# Patient Record
Sex: Female | Born: 1935 | Race: White | Hispanic: No | Smoking: Never smoker
Health system: Southern US, Community
[De-identification: ages and names within clinical notes are randomized; demographics above are authoritative.]

## PROBLEM LIST (undated history)

## (undated) DIAGNOSIS — E7800 Pure hypercholesterolemia, unspecified: Secondary | ICD-10-CM

## (undated) DIAGNOSIS — I10 Essential (primary) hypertension: Secondary | ICD-10-CM

## (undated) HISTORY — PX: BLADDER REPAIR: SHX76

## (undated) HISTORY — PX: ABDOMINAL HYSTERECTOMY: SHX81

---

## 2004-09-09 ENCOUNTER — Ambulatory Visit: Payer: Self-pay | Admitting: Internal Medicine

## 2005-12-14 ENCOUNTER — Ambulatory Visit: Payer: Self-pay | Admitting: Internal Medicine

## 2005-12-28 ENCOUNTER — Ambulatory Visit: Payer: Self-pay | Admitting: Podiatry

## 2005-12-29 ENCOUNTER — Ambulatory Visit: Payer: Self-pay | Admitting: Podiatry

## 2006-02-27 ENCOUNTER — Ambulatory Visit: Payer: Self-pay | Admitting: Unknown Physician Specialty

## 2006-10-31 ENCOUNTER — Ambulatory Visit: Payer: Self-pay | Admitting: Obstetrics and Gynecology

## 2006-11-07 ENCOUNTER — Inpatient Hospital Stay: Payer: Self-pay | Admitting: Obstetrics and Gynecology

## 2007-02-07 ENCOUNTER — Ambulatory Visit: Payer: Self-pay | Admitting: Internal Medicine

## 2007-12-06 ENCOUNTER — Ambulatory Visit: Payer: Self-pay | Admitting: Internal Medicine

## 2008-02-12 ENCOUNTER — Ambulatory Visit: Payer: Self-pay | Admitting: Internal Medicine

## 2008-03-05 ENCOUNTER — Ambulatory Visit: Payer: Self-pay | Admitting: Podiatry

## 2008-03-14 ENCOUNTER — Ambulatory Visit: Payer: Self-pay | Admitting: Podiatry

## 2009-02-09 ENCOUNTER — Ambulatory Visit: Payer: Self-pay | Admitting: Internal Medicine

## 2009-02-17 ENCOUNTER — Ambulatory Visit: Payer: Self-pay | Admitting: Internal Medicine

## 2009-03-10 ENCOUNTER — Ambulatory Visit: Payer: Self-pay | Admitting: Internal Medicine

## 2010-04-27 ENCOUNTER — Ambulatory Visit: Payer: Self-pay | Admitting: Internal Medicine

## 2010-10-27 ENCOUNTER — Ambulatory Visit: Payer: Self-pay | Admitting: Ophthalmology

## 2010-10-27 DIAGNOSIS — I119 Hypertensive heart disease without heart failure: Secondary | ICD-10-CM

## 2010-11-09 ENCOUNTER — Ambulatory Visit: Payer: Self-pay | Admitting: Ophthalmology

## 2011-01-27 ENCOUNTER — Ambulatory Visit: Payer: Self-pay | Admitting: Internal Medicine

## 2011-02-15 ENCOUNTER — Ambulatory Visit: Payer: Self-pay | Admitting: Ophthalmology

## 2011-05-02 ENCOUNTER — Ambulatory Visit: Payer: Self-pay | Admitting: Internal Medicine

## 2011-10-20 ENCOUNTER — Ambulatory Visit: Payer: Self-pay | Admitting: Internal Medicine

## 2011-10-25 ENCOUNTER — Ambulatory Visit: Payer: Self-pay | Admitting: Internal Medicine

## 2012-07-23 ENCOUNTER — Ambulatory Visit: Payer: Medicare Other | Admitting: Internal Medicine

## 2012-08-22 ENCOUNTER — Ambulatory Visit: Payer: Self-pay | Admitting: Family Medicine

## 2012-08-29 ENCOUNTER — Ambulatory Visit: Payer: Self-pay | Admitting: Physical Medicine and Rehabilitation

## 2012-10-01 ENCOUNTER — Other Ambulatory Visit: Payer: Self-pay | Admitting: *Deleted

## 2012-10-01 NOTE — Telephone Encounter (Signed)
Refill Request  Verapamil ER  240 mg tablet  #90  Take one tablet daily

## 2012-10-02 NOTE — Telephone Encounter (Signed)
Called pharmacy, advised  to send refill to [facility].

## 2012-10-02 NOTE — Telephone Encounter (Signed)
Pharmacy sent refill request. Does she need appointment first or ok to refill?

## 2012-10-02 NOTE — Telephone Encounter (Signed)
See my message. Has not called to establish care here. Notify pharmacy to send to [facility].

## 2012-10-02 NOTE — Telephone Encounter (Signed)
She was a pt of mine at [facility].  She has not called here to establish care.  Will need to notify pharmacy to let [facility] know - needs referral.  Cannot fill until she has contacted our office to establish care.

## 2012-10-04 ENCOUNTER — Other Ambulatory Visit: Payer: Self-pay | Admitting: *Deleted

## 2012-11-21 ENCOUNTER — Ambulatory Visit: Payer: Self-pay | Admitting: Family Medicine

## 2015-07-07 DIAGNOSIS — H353132 Nonexudative age-related macular degeneration, bilateral, intermediate dry stage: Secondary | ICD-10-CM | POA: Diagnosis not present

## 2015-09-01 DIAGNOSIS — Z0000 Encounter for general adult medical examination without abnormal findings: Secondary | ICD-10-CM | POA: Diagnosis not present

## 2015-09-01 DIAGNOSIS — I10 Essential (primary) hypertension: Secondary | ICD-10-CM | POA: Diagnosis not present

## 2015-09-04 DIAGNOSIS — I10 Essential (primary) hypertension: Secondary | ICD-10-CM | POA: Diagnosis not present

## 2015-09-11 DIAGNOSIS — H903 Sensorineural hearing loss, bilateral: Secondary | ICD-10-CM | POA: Diagnosis not present

## 2015-09-11 DIAGNOSIS — H6123 Impacted cerumen, bilateral: Secondary | ICD-10-CM | POA: Diagnosis not present

## 2015-10-01 DIAGNOSIS — E7800 Pure hypercholesterolemia, unspecified: Secondary | ICD-10-CM | POA: Diagnosis not present

## 2015-10-01 DIAGNOSIS — F325 Major depressive disorder, single episode, in full remission: Secondary | ICD-10-CM | POA: Diagnosis not present

## 2015-10-01 DIAGNOSIS — I10 Essential (primary) hypertension: Secondary | ICD-10-CM | POA: Diagnosis not present

## 2015-10-22 DIAGNOSIS — R002 Palpitations: Secondary | ICD-10-CM | POA: Diagnosis not present

## 2015-10-22 DIAGNOSIS — I10 Essential (primary) hypertension: Secondary | ICD-10-CM | POA: Diagnosis not present

## 2015-10-22 DIAGNOSIS — F324 Major depressive disorder, single episode, in partial remission: Secondary | ICD-10-CM | POA: Diagnosis not present

## 2015-10-22 DIAGNOSIS — E7800 Pure hypercholesterolemia, unspecified: Secondary | ICD-10-CM | POA: Diagnosis not present

## 2015-10-22 DIAGNOSIS — R9431 Abnormal electrocardiogram [ECG] [EKG]: Secondary | ICD-10-CM | POA: Diagnosis not present

## 2015-10-23 DIAGNOSIS — R9431 Abnormal electrocardiogram [ECG] [EKG]: Secondary | ICD-10-CM | POA: Diagnosis not present

## 2015-10-23 DIAGNOSIS — R002 Palpitations: Secondary | ICD-10-CM | POA: Diagnosis not present

## 2015-10-28 DIAGNOSIS — I10 Essential (primary) hypertension: Secondary | ICD-10-CM | POA: Diagnosis not present

## 2015-10-28 DIAGNOSIS — R9431 Abnormal electrocardiogram [ECG] [EKG]: Secondary | ICD-10-CM | POA: Diagnosis not present

## 2015-11-03 DIAGNOSIS — R000 Tachycardia, unspecified: Secondary | ICD-10-CM | POA: Diagnosis not present

## 2015-11-09 DIAGNOSIS — I493 Ventricular premature depolarization: Secondary | ICD-10-CM | POA: Diagnosis not present

## 2015-11-09 DIAGNOSIS — E7800 Pure hypercholesterolemia, unspecified: Secondary | ICD-10-CM | POA: Diagnosis not present

## 2015-11-09 DIAGNOSIS — R002 Palpitations: Secondary | ICD-10-CM | POA: Diagnosis not present

## 2015-11-09 DIAGNOSIS — I10 Essential (primary) hypertension: Secondary | ICD-10-CM | POA: Diagnosis not present

## 2015-11-19 ENCOUNTER — Emergency Department: Payer: PPO

## 2015-11-19 ENCOUNTER — Encounter: Payer: Self-pay | Admitting: Emergency Medicine

## 2015-11-19 ENCOUNTER — Emergency Department
Admission: EM | Admit: 2015-11-19 | Discharge: 2015-11-19 | Disposition: A | Discharge status: Home / Self Care | Payer: PPO | Attending: Emergency Medicine | Admitting: Emergency Medicine

## 2015-11-19 DIAGNOSIS — K5900 Constipation, unspecified: Secondary | ICD-10-CM | POA: Diagnosis not present

## 2015-11-19 DIAGNOSIS — I10 Essential (primary) hypertension: Secondary | ICD-10-CM | POA: Insufficient documentation

## 2015-11-19 HISTORY — DX: Essential (primary) hypertension: I10

## 2015-11-19 HISTORY — DX: Pure hypercholesterolemia, unspecified: E78.00

## 2015-11-19 MED ORDER — MAGNESIUM CITRATE PO SOLN: 0.5000 | Freq: Once | ORAL | Status: DC

## 2015-11-19 MED ORDER — HYDROCORTISONE 2.5 % RE CREA: 1.0000 "application " | TOPICAL_CREAM | Freq: Two times a day (BID) | RECTAL | Status: AC

## 2015-11-19 NOTE — Discharge Instructions (Signed)

## 2015-11-19 NOTE — ED Provider Notes (Signed)
[facility] Emergency Department Provider Note  ____________________________________________  Time seen: Approximately 7:33 PM  I have reviewed the triage vital signs and the nursing notes.   HISTORY  Chief Complaint Constipation   HPI Jasmine Chung is a 82 y.o. female who presents to the emergency department for evaluation of constipation. Symptoms started about a month ago. Her primary care provider called in a prescription for lactulose, which worked initially but has since stopped working. She also complains of bloating and feeling very gassy. She denies change in medication or taking narcotic pain medications. She has also added more fiber to her diet and is drinking a lot of water without relief. She states she passes very small, hard stool when she is able to go. She denies obvious blood or tarry stools. Other than the cramping associated with the gas, she denies abdominal pain. She denies vomiting.   Past Medical History  Diagnosis Date  . Hypertension   . Hypercholesteremia     There are no active problems to display for this patient.   Past Surgical History  Procedure Laterality Date  . Abdominal hysterectomy    . Bladder repair      Current Outpatient Rx  Name  Route  Sig  Dispense  Refill  . hydrocortisone (PROCTO-MED HC) 2.5 % rectal cream   Rectal   Place 1 application rectally 2 (two) times daily.   30 g   0   . magnesium citrate SOLN   Oral   Take 148 mLs (0.5 Bottles total) by mouth once.   195 mL   0     Drink 1/2 bottle. If no results in 6 hours, drink  ...   . verapamil (CALAN-SR) 240 MG CR tablet   Oral   Take 240 mg by mouth at bedtime.           Allergies Review of patient's allergies indicates no known allergies.  History reviewed. No pertinent family history.  Social History Social History  Substance Use Topics  . Smoking status: Never Smoker   . Smokeless tobacco: None  . Alcohol Use: Yes     Review of Systems Constitutional: No fever/chills Eyes: No visual changes. ENT: No sore throat. Cardiovascular: Denies chest pain. Respiratory: Denies shortness of breath. Gastrointestinal: Occasional crampy abdominal pain.  No vomiting.  No diarrhea.  Positive for constipation. Genitourinary: Negative for dysuria.  ____________________________________________   PHYSICAL EXAM:  VITAL SIGNS: ED Triage Vitals  Enc Vitals Group     BP 11/19/15 1920 191/89 mmHg     Pulse Rate 11/19/15 1920 74     Resp 11/19/15 1920 20     Temp 11/19/15 1920 97.7 F (36.5 C)     Temp Source 11/19/15 1920 Oral     SpO2 11/19/15 1920 98 %     Weight 11/19/15 1920 116 lb (52.617 kg)     Height 11/19/15 1920 5' (1.524 m)     Head Cir --      Peak Flow --      Pain Score 11/19/15 1921 0     Pain Loc --      Pain Edu? --      Excl. in [place]? --     Constitutional: Alert and oriented. Well appearing and in no acute distress. Eyes: Conjunctivae are normal.  EOMI. Head: Atraumatic. Nose: No congestion/rhinnorhea. Respiratory: Normal respiratory effort.  No retractions. Gastrointestinal: Soft and nontender. No distention. No abdominal bruits. Musculoskeletal: No lower extremity tenderness nor  edema.  No joint effusions. Neurologic:  Normal speech and language. No gross focal neurologic deficits are appreciated. No gait instability. Skin:  Skin is warm, dry and intact. No rash noted. Psychiatric: Mood and affect are normal. Speech and behavior are normal.  ____________________________________________   LABS (all labs ordered are listed, but only abnormal results are displayed)  Labs Reviewed - No data to display ____________________________________________  EKG   ____________________________________________  RADIOLOGY  Abdomen with large amount of fecal matter throughout the colon. Small bowel gas pattern is  normal. ____________________________________________   PROCEDURES  Procedure(s) performed:   Procedures  Critical Care performed:   ____________________________________________   INITIAL IMPRESSION / ASSESSMENT AND PLAN / ED COURSE  Pertinent labs & imaging results that were available during my care of the patient were reviewed by me and considered in my medical decision making (see chart for details).  Patient to take 1/2 bottle of magnesium citrate. If no/poor results after 6 hours, she is to drink the other half. She is to schedule a follow up appointment with Dr. Gustavo Lah. She was also given a prescription for hydrocortisone 2.5% as she reports internal hemorrhoids in the past. She was advised to return to the emergency department for increase in abdominal pain or onset of vomiting or fever.  ____________________________________________   FINAL CLINICAL IMPRESSION(S) / ED DIAGNOSES  Final diagnoses:  Constipation, unspecified constipation type      NEW MEDICATIONS STARTED DURING THIS VISIT:  Discharge Medication List as of 11/19/2015  8:24 PM    START taking these medications   Details  hydrocortisone (PROCTO-MED HC) 2.5 % rectal cream Place 1 application rectally 2 (two) times daily., Starting 11/19/2015, Until Discontinued, Print    magnesium citrate SOLN Take 148 mLs (0.5 Bottles total) by mouth once., Starting 11/19/2015, Print         Note:  This document was prepared using Dragon voice recognition software and may include unintentional dictation errors.    Victorino Dike, FNP 11/19/15 2121  Nena Polio, MD 11/19/15 [phone]

## 2015-11-19 NOTE — ED Notes (Signed)
Pt arrived to the ED for complaints of constipation. Pt states that she has been seeing her primary health care provider for the last 3 months for constipation and reports that the medication prescribed to her works intermittently, therefor she wants to be seen for constipation today. Pt denies abdominal pain, reports being "gassy" and reports that her last BM was today. Pt is AOx4 in no apparent distress.

## 2015-11-21 ENCOUNTER — Emergency Department
Admission: EM | Admit: 2015-11-21 | Discharge: 2015-11-21 | Disposition: A | Discharge status: Home / Self Care | Payer: PPO | Attending: Student | Admitting: Student

## 2015-11-21 ENCOUNTER — Emergency Department: Payer: PPO

## 2015-11-21 DIAGNOSIS — K5900 Constipation, unspecified: Secondary | ICD-10-CM | POA: Diagnosis not present

## 2015-11-21 DIAGNOSIS — K5901 Slow transit constipation: Secondary | ICD-10-CM

## 2015-11-21 DIAGNOSIS — I10 Essential (primary) hypertension: Secondary | ICD-10-CM | POA: Insufficient documentation

## 2015-11-21 NOTE — ED Provider Notes (Signed)
[facility] Emergency Department Provider Note   ____________________________________________  Time seen: Approximately 8:21 AM  I have reviewed the triage vital signs and the nursing notes.   HISTORY  Chief Complaint Constipation    HPI Jasmine Chung is a 82 y.o. female with history of hyperlipidemia, hypertension, depression, history of intermittent heart palpitations who presents for evaluation of continued constipation ongoing for one month, gradual onset, improved with magnesium citrate. Patient was seen here in this emergency department on 11/19/15 for the same complaints and was discharged with magnesium citrate. She reports that she took the magnesium citrate and had large bowel movements yesterday and felt better but has not had a bowel movement today and she still  feels "a little full in my belly and I wanted to make sure that stool was all gone". She denies any abdominal pain, no nausea or vomiting. No fevers or chills. No chest pain or difficulty breathing.   Past Medical History  Diagnosis Date  . Hypertension   . Hypercholesteremia     There are no active problems to display for this patient.   Past Surgical History  Procedure Laterality Date  . Abdominal hysterectomy    . Bladder repair      Current Outpatient Rx  Name  Route  Sig  Dispense  Refill  . hydrocortisone (PROCTO-MED HC) 2.5 % rectal cream   Rectal   Place 1 application rectally 2 (two) times daily.   30 g   0   . magnesium citrate SOLN   Oral   Take 148 mLs (0.5 Bottles total) by mouth once.   195 mL   0     Drink 1/2 bottle. If no results in 6 hours, drink  ...   . verapamil (CALAN-SR) 240 MG CR tablet   Oral   Take 240 mg by mouth at bedtime.           Allergies Review of patient's allergies indicates no known allergies.  No family history on file.  Social History Social History  Substance Use Topics  . Smoking status: Never Smoker   . Smokeless  tobacco: None  . Alcohol Use: Yes    Review of Systems Constitutional: No fever/chills Eyes: No visual changes. ENT: No sore throat. Cardiovascular: Denies chest pain. Respiratory: Denies shortness of breath. Gastrointestinal: No abdominal pain.  No nausea, no vomiting.  No diarrhea.  + constipation. Genitourinary: Negative for dysuria. Musculoskeletal: Negative for back pain. Skin: Negative for rash. Neurological: Negative for headaches, focal weakness or numbness.  10-point ROS otherwise negative.  ____________________________________________   PHYSICAL EXAM:   VITAL SIGNS: ED Triage Vitals  Enc Vitals Group     BP 11/21/15 0807 162/78 mmHg     Pulse Rate 11/21/15 0807 79     Resp 11/21/15 0807 18     Temp 11/21/15 0807 97.7 F (36.5 C)     Temp Source 11/21/15 0807 Oral     SpO2 11/21/15 0807 96 %     Weight 11/21/15 0807 116 lb (52.617 kg)     Height 11/21/15 0807 5' (1.524 m)     Head Cir --      Peak Flow --      Pain Score 11/21/15 0807 0     Pain Loc --      Pain Edu? --      Excl. in [place]? --     Constitutional: Alert and oriented. Well appearing and in no acute distress. Eyes: Conjunctivae  are normal. PERRL. EOMI. Head: Atraumatic. Nose: No congestion/rhinnorhea. Mouth/Throat: Mucous membranes are moist.  Oropharynx non-erythematous. Neck: No stridor.  Cardiovascular: Normal rate, regular rhythm. Grossly normal heart sounds.  Good peripheral circulation. Respiratory: Normal respiratory effort.  No retractions. Lungs CTAB. Gastrointestinal: Soft and nontender. No distention. Normal bowel sounds. No CVA tenderness. Genitourinary: deferred Musculoskeletal: No lower extremity tenderness nor edema.  No joint effusions. Neurologic:  Normal speech and language. No gross focal neurologic deficits are appreciated. No gait instability. Skin:  Skin is warm, dry and intact. No rash noted. Psychiatric: Mood and affect are normal. Speech and behavior are  normal.  ____________________________________________   LABS (all labs ordered are listed, but only abnormal results are displayed)  Labs Reviewed - No data to display ____________________________________________  EKG  none ____________________________________________  RADIOLOGY  3 view abdominal plain films IMPRESSION: Less stool in colon compared to 2 days prior. Moderate stool remains throughout colon. No bowel obstruction or free air.  No lung edema or consolidation. There is extensive aortic and iliac artery atherosclerosis. ____________________________________________   PROCEDURES  Procedure(s) performed: None  Procedures  Critical Care performed: No  ____________________________________________   INITIAL IMPRESSION / ASSESSMENT AND PLAN / ED COURSE  Pertinent labs & imaging results that were available during my care of the patient were reviewed by me and considered in my medical decision making (see chart for details).  Jasmine Chung is a 82 y.o. female with history of hyperlipidemia, hypertension, depression, history of intermittent heart palpitations who presents for evaluation of continued constipation ongoing for one month. On exam, she is very well-appearing and in no acute distress. Her vital signs are stable, she is afebrile. She has a benign soft abdomen which is nontender, nondistended, normal bowel sounds. Plain films show decreased stool in the colon compared to 2 days ago, no obstruction or perforation. I discussed with her that that she has less stool than she did a few days ago and she is very relieved. We discussed that she should continue MiraLAX twice daily until having 1 bowel movement per day after which she can decrease to MiraLAX once daily. She has previously scheduled follow-up with her primary care doctor in this coming week as well as a GI appointment on the 20th. I encouraged her to keep both of those appointments. We discussed return  precautions, need for close follow-up and she is comfortable with the discharge plan. DC home. ____________________________________________   FINAL CLINICAL IMPRESSION(S) / ED DIAGNOSES  Final diagnoses:  Slow transit constipation      NEW MEDICATIONS STARTED DURING THIS VISIT:  New Prescriptions   No medications on file     Note:  This document was prepared using Dragon voice recognition software and may include unintentional dictation errors.    Joanne Gavel, MD 11/21/15 [phone]

## 2015-11-21 NOTE — ED Notes (Signed)
Pt states she was seen 2 days ago for constipation states she took magnesium citrate states she has had multiple bowel movements yesterday but states she still feels full in her abdomen

## 2015-11-21 NOTE — ED Notes (Signed)
Pt states she was seen here 7/6 for constipation and was to follow up with PCP, states she is still feeling constipated.Marland Kitchen

## 2015-11-26 DIAGNOSIS — K5900 Constipation, unspecified: Secondary | ICD-10-CM | POA: Diagnosis not present

## 2015-11-26 DIAGNOSIS — J3089 Other allergic rhinitis: Secondary | ICD-10-CM | POA: Diagnosis not present

## 2015-11-28 ENCOUNTER — Emergency Department
Admission: EM | Admit: 2015-11-28 | Discharge: 2015-11-28 | Disposition: A | Discharge status: Home / Self Care | Payer: PPO | Attending: Emergency Medicine | Admitting: Emergency Medicine

## 2015-11-28 ENCOUNTER — Encounter: Payer: Self-pay | Admitting: Emergency Medicine

## 2015-11-28 ENCOUNTER — Emergency Department: Payer: PPO

## 2015-11-28 DIAGNOSIS — I10 Essential (primary) hypertension: Secondary | ICD-10-CM | POA: Insufficient documentation

## 2015-11-28 DIAGNOSIS — E785 Hyperlipidemia, unspecified: Secondary | ICD-10-CM | POA: Insufficient documentation

## 2015-11-28 DIAGNOSIS — K5900 Constipation, unspecified: Secondary | ICD-10-CM | POA: Diagnosis not present

## 2015-11-28 LAB — MAGNESIUM: MAGNESIUM: 2.1 mg/dL (ref 1.7–2.4)

## 2015-11-28 LAB — CBC WITH DIFFERENTIAL/PLATELET
Basophils Absolute: 0 10*3/uL (ref 0–0.1)
Basophils Relative: 1 %
EOS ABS: 0.1 10*3/uL (ref 0–0.7)
EOS PCT: 2 %
HCT: 37.9 % (ref 35.0–47.0)
Hemoglobin: 13.2 g/dL (ref 12.0–16.0)
Lymphocytes Relative: 24 %
Lymphs Abs: 1.5 10*3/uL (ref 1.0–3.6)
MCH: 29.5 pg (ref 26.0–34.0)
MCHC: 34.7 g/dL (ref 32.0–36.0)
MCV: 85.1 fL (ref 80.0–100.0)
Monocytes Absolute: 0.6 10*3/uL (ref 0.2–0.9)
Monocytes Relative: 9 %
NEUTROS PCT: 64 %
Neutro Abs: 4.1 10*3/uL (ref 1.4–6.5)
PLATELETS: 196 10*3/uL (ref 150–440)
RBC: 4.46 MIL/uL (ref 3.80–5.20)
RDW: 13 % (ref 11.5–14.5)
WBC: 6.4 10*3/uL (ref 3.6–11.0)

## 2015-11-28 LAB — BASIC METABOLIC PANEL
Anion gap: 8 (ref 5–15)
CALCIUM: 8.6 mg/dL — AB (ref 8.9–10.3)
CO2: 28 mmol/L (ref 22–32)
Chloride: 91 mmol/L — ABNORMAL LOW (ref 101–111)
Creatinine, Ser: 0.62 mg/dL (ref 0.44–1.00)
GFR calc Af Amer: >60 mL/min (ref >60)
GLUCOSE: 90 mg/dL (ref 65–99)
POTASSIUM: 3.6 mmol/L (ref 3.5–5.1)
Sodium: 127 mmol/L — ABNORMAL LOW (ref 135–145)

## 2015-11-28 LAB — SODIUM: Sodium: 128 mmol/L — ABNORMAL LOW (ref 135–145)

## 2015-11-28 MED ORDER — SODIUM CHLORIDE 0.9 % IV BOLUS (SEPSIS)
1000.0000 mL | Freq: Once | INTRAVENOUS | Status: AC
  Administered 2015-11-28: 1000 mL via INTRAVENOUS

## 2015-11-28 NOTE — Discharge Instructions (Signed)
Take senna and colace once a day everyday. Take probiotics once a day everyday. Dissolve 3 caps of miralax into 500cc bottle of your drink of choice and drink one bottle a day for 3-5 days if you go 5-7 days without a bowel movement. Return to the ER if you have abdominal pain, nausea, vomiting, or any new symptoms concerning to you.

## 2015-11-28 NOTE — ED Notes (Signed)
Patient seen by PCP at [facility], [place] completed showing "a tremendous amount of stool" on thursday per patient's recollection. PCP ordered patient to restart previously prescribed medication on friday: Lactulose, Stool Softener, and fleets enema. Patient states no resolution.

## 2015-11-28 NOTE — ED Notes (Signed)
Pt resting in bed, family at bedside, pt had 1 BM in toilet, MD aware

## 2015-11-28 NOTE — ED Notes (Signed)
States she has been seen couple of times and has seen her PCP for constipation  Still unable to have BM  Positive nausea

## 2015-11-28 NOTE — ED Provider Notes (Signed)
[facility] Emergency Department Provider Note  ____________________________________________  Time seen: Approximately 1:15 PM  I have reviewed the triage vital signs and the nursing notes.   HISTORY  Chief Complaint Constipation   HPI Jasmine Chung is a 82 y.o. female with a history of hypertension, hyperlipidemia, and constipation who presents for evaluation of constipation. Patient has been struggling with constipation for the last month. Has been seen here twice in by her primary care doctor. She is currently taking Colace twice a day, lactulose twice a day, probiotics, and use 3 Fleet enemas since last night with no bowel movement. She reports she has not had a normal bowel movement in a month. She had a small one this morning. She denies nausea, vomiting, abdominal pain. She is passing flatus. She is status post hysterectomy many years ago. She denies any prior history of SBO. She denies fever, chest pain, shortness of breath. She was seen by her primary care doctor 2 days ago and was started on the lactulose however has got no relief from it and decided to present here for evaluation.  Past Medical History  Diagnosis Date  . Hypertension   . Hypercholesteremia     There are no active problems to display for this patient.   Past Surgical History  Procedure Laterality Date  . Abdominal hysterectomy    . Bladder repair      Current Outpatient Rx  Name  Route  Sig  Dispense  Refill  . hydrocortisone (PROCTO-MED HC) 2.5 % rectal cream   Rectal   Place 1 application rectally 2 (two) times daily.   30 g   0   . magnesium citrate SOLN   Oral   Take 148 mLs (0.5 Bottles total) by mouth once.   195 mL   0     Drink 1/2 bottle. If no results in 6 hours, drink  ...   . verapamil (CALAN-SR) 240 MG CR tablet   Oral   Take 240 mg by mouth at bedtime.           Allergies Review of patient's allergies indicates no known allergies.  No family  history on file.  Social History Social History  Substance Use Topics  . Smoking status: Never Smoker   . Smokeless tobacco: None  . Alcohol Use: Yes    Review of Systems  Constitutional: Negative for fever. Eyes: Negative for visual changes. ENT: Negative for sore throat. Cardiovascular: Negative for chest pain. Respiratory: Negative for shortness of breath. Gastrointestinal: Negative for abdominal pain, vomiting or diarrhea. + Constipation Genitourinary: Negative for dysuria. Musculoskeletal: Negative for back pain. Skin: Negative for rash. Neurological: Negative for headaches, weakness or numbness.  ____________________________________________   PHYSICAL EXAM:  VITAL SIGNS: ED Triage Vitals  Enc Vitals Group     BP 11/28/15 1229 169/81 mmHg     Pulse Rate 11/28/15 1229 73     Resp 11/28/15 1229 20     Temp 11/28/15 1229 97.6 F (36.4 C)     Temp Source 11/28/15 1229 Oral     SpO2 11/28/15 1229 99 %     Weight 11/28/15 1229 116 lb (52.617 kg)     Height 11/28/15 1229 5' (1.524 m)     Head Cir --      Peak Flow --      Pain Score 11/28/15 1229 2     Pain Loc --      Pain Edu? --  Excl. in [place]? --     Constitutional: Alert and oriented. Well appearing and in no apparent distress. HEENT:      Head: Normocephalic and atraumatic.         Eyes: Conjunctivae are normal. Sclera is non-icteric. EOMI. PERRL      Mouth/Throat: Mucous membranes are moist.       Neck: Supple with no signs of meningismus. Cardiovascular: Regular rate and rhythm. No murmurs, gallops, or rubs. 2+ symmetrical distal pulses are present in all extremities. No JVD. Respiratory: Normal respiratory effort. Lungs are clear to auscultation bilaterally. No wheezes, crackles, or rhonchi.  Gastrointestinal: Soft, non tender, and non distended with positive bowel sounds. No rebound or guarding.Rectal exam with no impacted stool  Genitourinary: No CVA tenderness. Musculoskeletal: Nontender with  normal range of motion in all extremities. No edema, cyanosis, or erythema of extremities. Neurologic: Normal speech and language. Face is symmetric. Moving all extremities. No gross focal neurologic deficits are appreciated. Skin: Skin is warm, dry and intact. No rash noted. Psychiatric: Mood and affect are normal. Speech and behavior are normal.  ____________________________________________   LABS (all labs ordered are listed, but only abnormal results are displayed)  Labs Reviewed  BASIC METABOLIC PANEL - Abnormal; Notable for the following:    Sodium 127 (*)    Chloride 91 (*)    BUN <5 (*)    Calcium 8.6 (*)    All other components within normal limits  SODIUM - Abnormal; Notable for the following:    Sodium 128 (*)    All other components within normal limits  MAGNESIUM  CBC WITH DIFFERENTIAL/PLATELET   ____________________________________________  EKG  none ____________________________________________  RADIOLOGY  KUB: mild stool burden with no evidence of SBO ____________________________________________   PROCEDURES  Procedure(s) performed: None Critical Care performed:  None ____________________________________________   INITIAL IMPRESSION / ASSESSMENT AND PLAN / ED COURSE  82 y.o. female with a history of hypertension, hyperlipidemia, and constipation who presents for evaluation of constipation x 1 month. No signs or symptoms of SBO, patient's passing flatus, no distention, no abdominal pain, no nausea, no vomiting. Her abdominal exam is completely benign. Rectal exam with no impacted stool. We'll repeat KUB to evaluate stool burden, check electrolytes, give her IV fluids and soapsuds enema here.  ED course: Patient received one enema and had a normal size bowel movement. KUB showing small stool burden. Labs showing mild hyponatremia which improved with hydration. Serial abdominal exams remain benign. Patient was dc home on standing senna, colace, probiotics,  increase PO hydration and PRN MIralax for constipation.   Pertinent labs & imaging results that were available during my care of the patient were reviewed by me and considered in my medical decision making (see chart for details).   I discussed my evaluation of the patient's symptoms, my clinical impression, and my proposed outpatient treatment plan with patient/ family members. We have discussed anticipatory guidance, scheduled follow-up, and careful return precautions. The patient expresses understanding and is comfortable with the discharge plan. All patient's questions were answered.    ____________________________________________   FINAL CLINICAL IMPRESSION(S) / ED DIAGNOSES  Final diagnoses:  Constipation, unspecified constipation type      NEW MEDICATIONS STARTED DURING THIS VISIT:  Discharge Medication List as of 11/28/2015  3:57 PM       Note:  This document was prepared using Dragon voice recognition software and may include unintentional dictation errors.    Rudene Re, MD 11/28/15 2010

## 2015-12-03 DIAGNOSIS — K5909 Other constipation: Secondary | ICD-10-CM | POA: Diagnosis not present

## 2016-01-25 DIAGNOSIS — E7800 Pure hypercholesterolemia, unspecified: Secondary | ICD-10-CM | POA: Diagnosis not present

## 2016-01-25 DIAGNOSIS — F324 Major depressive disorder, single episode, in partial remission: Secondary | ICD-10-CM | POA: Diagnosis not present

## 2016-01-25 DIAGNOSIS — I10 Essential (primary) hypertension: Secondary | ICD-10-CM | POA: Diagnosis not present

## 2016-01-26 DIAGNOSIS — H353221 Exudative age-related macular degeneration, left eye, with active choroidal neovascularization: Secondary | ICD-10-CM | POA: Diagnosis not present

## 2016-03-01 DIAGNOSIS — R002 Palpitations: Secondary | ICD-10-CM | POA: Diagnosis not present

## 2016-03-01 DIAGNOSIS — I493 Ventricular premature depolarization: Secondary | ICD-10-CM | POA: Diagnosis not present

## 2016-03-01 DIAGNOSIS — I10 Essential (primary) hypertension: Secondary | ICD-10-CM | POA: Diagnosis not present

## 2016-03-01 DIAGNOSIS — E7800 Pure hypercholesterolemia, unspecified: Secondary | ICD-10-CM | POA: Diagnosis not present

## 2016-03-11 DIAGNOSIS — H353221 Exudative age-related macular degeneration, left eye, with active choroidal neovascularization: Secondary | ICD-10-CM | POA: Diagnosis not present

## 2016-04-15 DIAGNOSIS — H353221 Exudative age-related macular degeneration, left eye, with active choroidal neovascularization: Secondary | ICD-10-CM | POA: Diagnosis not present

## 2016-04-25 DIAGNOSIS — F325 Major depressive disorder, single episode, in full remission: Secondary | ICD-10-CM | POA: Diagnosis not present

## 2016-04-25 DIAGNOSIS — Z23 Encounter for immunization: Secondary | ICD-10-CM | POA: Diagnosis not present

## 2016-04-25 DIAGNOSIS — Z131 Encounter for screening for diabetes mellitus: Secondary | ICD-10-CM | POA: Diagnosis not present

## 2016-04-25 DIAGNOSIS — E7800 Pure hypercholesterolemia, unspecified: Secondary | ICD-10-CM | POA: Diagnosis not present

## 2016-04-25 DIAGNOSIS — I10 Essential (primary) hypertension: Secondary | ICD-10-CM | POA: Diagnosis not present

## 2016-05-20 DIAGNOSIS — H353221 Exudative age-related macular degeneration, left eye, with active choroidal neovascularization: Secondary | ICD-10-CM | POA: Diagnosis not present

## 2016-06-24 DIAGNOSIS — H353221 Exudative age-related macular degeneration, left eye, with active choroidal neovascularization: Secondary | ICD-10-CM | POA: Diagnosis not present

## 2016-07-19 DIAGNOSIS — I10 Essential (primary) hypertension: Secondary | ICD-10-CM | POA: Diagnosis not present

## 2016-07-19 DIAGNOSIS — Z131 Encounter for screening for diabetes mellitus: Secondary | ICD-10-CM | POA: Diagnosis not present

## 2016-07-19 DIAGNOSIS — E7800 Pure hypercholesterolemia, unspecified: Secondary | ICD-10-CM | POA: Diagnosis not present

## 2016-07-26 DIAGNOSIS — R413 Other amnesia: Secondary | ICD-10-CM | POA: Diagnosis not present

## 2016-08-05 DIAGNOSIS — H353221 Exudative age-related macular degeneration, left eye, with active choroidal neovascularization: Secondary | ICD-10-CM | POA: Diagnosis not present

## 2016-09-02 DIAGNOSIS — H353222 Exudative age-related macular degeneration, left eye, with inactive choroidal neovascularization: Secondary | ICD-10-CM | POA: Diagnosis not present

## 2016-09-06 DIAGNOSIS — I10 Essential (primary) hypertension: Secondary | ICD-10-CM | POA: Diagnosis not present

## 2016-09-06 DIAGNOSIS — M810 Age-related osteoporosis without current pathological fracture: Secondary | ICD-10-CM | POA: Diagnosis not present

## 2016-09-06 DIAGNOSIS — E7800 Pure hypercholesterolemia, unspecified: Secondary | ICD-10-CM | POA: Diagnosis not present

## 2016-09-06 DIAGNOSIS — Z0000 Encounter for general adult medical examination without abnormal findings: Secondary | ICD-10-CM | POA: Diagnosis not present

## 2016-09-06 DIAGNOSIS — F325 Major depressive disorder, single episode, in full remission: Secondary | ICD-10-CM | POA: Diagnosis not present

## 2016-09-06 DIAGNOSIS — Z23 Encounter for immunization: Secondary | ICD-10-CM | POA: Diagnosis not present

## 2016-09-15 DIAGNOSIS — M810 Age-related osteoporosis without current pathological fracture: Secondary | ICD-10-CM | POA: Diagnosis not present

## 2016-09-26 DIAGNOSIS — I10 Essential (primary) hypertension: Secondary | ICD-10-CM | POA: Diagnosis not present

## 2016-09-26 DIAGNOSIS — M810 Age-related osteoporosis without current pathological fracture: Secondary | ICD-10-CM | POA: Diagnosis not present

## 2016-09-30 DIAGNOSIS — H353221 Exudative age-related macular degeneration, left eye, with active choroidal neovascularization: Secondary | ICD-10-CM | POA: Diagnosis not present

## 2016-10-28 DIAGNOSIS — H353221 Exudative age-related macular degeneration, left eye, with active choroidal neovascularization: Secondary | ICD-10-CM | POA: Diagnosis not present

## 2016-11-07 DIAGNOSIS — M810 Age-related osteoporosis without current pathological fracture: Secondary | ICD-10-CM | POA: Diagnosis not present

## 2016-11-10 ENCOUNTER — Encounter: Payer: Self-pay | Admitting: Emergency Medicine

## 2016-11-10 ENCOUNTER — Emergency Department
Admission: EM | Admit: 2016-11-10 | Discharge: 2016-11-10 | Disposition: A | Discharge status: Home / Self Care | Payer: PPO | Attending: Emergency Medicine | Admitting: Emergency Medicine

## 2016-11-10 ENCOUNTER — Emergency Department: Payer: PPO

## 2016-11-10 DIAGNOSIS — I10 Essential (primary) hypertension: Secondary | ICD-10-CM | POA: Insufficient documentation

## 2016-11-10 DIAGNOSIS — K5900 Constipation, unspecified: Secondary | ICD-10-CM

## 2016-11-10 DIAGNOSIS — Z79899 Other long term (current) drug therapy: Secondary | ICD-10-CM | POA: Insufficient documentation

## 2016-11-10 MED ORDER — FLEET ENEMA 7-19 GM/118ML RE ENEM
1.0000 | ENEMA | Freq: Once | RECTAL | Status: AC
  Administered 2016-11-10: 1 via RECTAL

## 2016-11-10 MED ORDER — MAGNESIUM CITRATE PO SOLN: 0.5000 | Freq: Once | ORAL | 0 refills | Status: AC

## 2016-11-10 NOTE — ED Notes (Signed)
Patient has returned from x-ray.  Waiting to be seen, reassured about wait.  No new complaints.

## 2016-11-10 NOTE — Discharge Instructions (Signed)
Continue miralax twice daily.   Drink plenty of fluids.   Try mag citrate as needed.   Continue colace and senna.   You may need referral to GI doctor from your primary care doctor if you have persistent constipation   See primary care doctor in a week   Return to ER if you have fever, severe abdominal pain, vomiting, worse constipation, abdominal distention.

## 2016-11-10 NOTE — ED Triage Notes (Signed)
Pt c/o constipation for 3-4 weeks. Reports will have bowel movement if takes stool softener and laxative together but not if she does not do this.  Last bowel movement yesterday.  No vomiting. Denies any abdominal pain.  ambulatory to triage.

## 2016-11-10 NOTE — ED Notes (Signed)
Patient requesting X-ray results and to leave.  Reassured and asked to wait.  Patient denies any pain and agrees to wait.

## 2016-11-10 NOTE — ED Provider Notes (Signed)
[facility] Provider Note   CSN: [id number] Arrival date & time: 11/10/16  0847     History   Chief Complaint Chief Complaint  Patient presents with  . Constipation    HPI Jasmine Chung is a 83 y.o. female history of hypertension, hyperlipidemia here presenting with constipation. Patient states that she's been constipated for the last 3-4 weeks. She's been taking MiraLAX twice daily with minimal relief. Last several days, she has been trying some stools darkness such as Colace as well as some laxatives and had a small bowel movement yesterday. Denies any nausea or vomiting or significant pain. Denies any urinary symptoms. Patient was seen here in the ED last year for constipation that improved with enema.  Denies any history of bowel obstruction.  The history is provided by the patient.    Past Medical History:  Diagnosis Date  . Hypercholesteremia   . Hypertension     There are no active problems to display for this patient.   Past Surgical History:  Procedure Laterality Date  . ABDOMINAL HYSTERECTOMY    . BLADDER REPAIR      OB History    No data available       Home Medications    Prior to Admission medications   Medication Sig Start Date End Date Taking? Authorizing Provider  hydrocortisone (PROCTO-MED HC) 2.5 % rectal cream Place 1 application rectally 2 (two) times daily. 11/19/15   Triplett, Cari B, FNP  magnesium citrate SOLN Take 148 mLs (0.5 Bottles total) by mouth once. 11/19/15   Triplett, Cari B, FNP  verapamil (CALAN-SR) 240 MG CR tablet Take 240 mg by mouth at bedtime.    [provider]    Family History History reviewed. No pertinent family history.  Social History Social History  Substance Use Topics  . Smoking status: Never Smoker  . Smokeless tobacco: Not on file  . Alcohol use Yes     Allergies   Patient has no known allergies.   Review of Systems Review of Systems  Gastrointestinal: Positive for  constipation.  All other systems reviewed and are negative.    Physical Exam Updated Vital Signs BP (!) 159/77   Pulse 76   Temp 98 F (36.7 C) (Oral)   Resp 18   Ht 5' (1.524 m)   Wt 49.9 kg (110 lb)   SpO2 99%   BMI 21.48 kg/m   Physical Exam  Constitutional: She is oriented to person, place, and time. She appears well-developed.  HENT:  Head: Normocephalic.  Mouth/Throat: Oropharynx is clear and moist.  Eyes: Conjunctivae and EOM are normal. Pupils are equal, round, and reactive to light.  Neck: Normal range of motion. Neck supple.  Cardiovascular: Normal rate, regular rhythm and normal heart sounds.   Pulmonary/Chest: Effort normal and breath sounds normal. No respiratory distress. She has no wheezes. She has no rales.  Abdominal: Soft. Bowel sounds are normal.  Mild LLQ tenderness   Genitourinary:  Genitourinary Comments: Rectal- no stool impaction   Musculoskeletal: Normal range of motion. She exhibits no edema.  Neurological: She is alert and oriented to person, place, and time. No cranial nerve deficit. Coordination normal.  Skin: Skin is warm.  Psychiatric: She has a normal mood and affect.  Nursing note and vitals reviewed.    ED Treatments / Results  Labs (all labs ordered are listed, but only abnormal results are displayed) Labs Reviewed - No data to display  EKG  EKG Interpretation None  Radiology Dg Abdomen 1 View  Result Date: 11/10/2016 CLINICAL DATA:  Three-week history of constipation. EXAM: ABDOMEN - 1 VIEW COMPARISON:  11/28/2015 FINDINGS: The bowel gas pattern is unremarkable. Scattered air in the small bowel and colon. No significant stool burden. The soft tissue shadows are maintained. No free air. The bony structures are unremarkable. Stable extensive vascular calcifications. The bony structures are intact. IMPRESSION: No plain film findings for an acute abdominal process. No significant stool burden. Electronically Signed   By: Marijo Sanes M.D.   On: 11/10/2016 09:52    Procedures Procedures (including critical care time)  Medications Ordered in ED Medications  sodium phosphate (FLEET) 7-19 GM/118ML enema 1 enema (1 enema Rectal Given 11/10/16 1142)     Initial Impression / Assessment and Plan / ED Course  I have reviewed the triage vital signs and the nursing notes.  Pertinent labs & imaging results that were available during my care of the patient were reviewed by me and considered in my medical decision making (see chart for details).    Jasmine Chung is a 83 y.o. female here with constipation, mild LLQ tenderness. Afebrile, no vomiting. Well appearing. Has hx of constipation and xray today showed mild constipation and no SBO. I doubt diverticulitis or colitis. She wants to hold off on labs and just get enema to help with constipation.   12:18 PM xrays showed mild constipation. Enema given and some liquid stool came out. Felt better. She had tried mag citrate before which helped. Will try mag citrate in addition to miralax for now. If she has fever, severe pain, abdominal distention, then will return and then might need labs, CT ab/pel.     Final Clinical Impressions(s) / ED Diagnoses   Final diagnoses:  None    New Prescriptions New Prescriptions   No medications on file     Drenda Freeze, MD 11/10/16 1219

## 2016-11-22 DIAGNOSIS — R140 Abdominal distension (gaseous): Secondary | ICD-10-CM | POA: Diagnosis not present

## 2016-11-22 DIAGNOSIS — K5909 Other constipation: Secondary | ICD-10-CM | POA: Diagnosis not present

## 2016-11-25 DIAGNOSIS — K5909 Other constipation: Secondary | ICD-10-CM | POA: Diagnosis not present

## 2016-11-29 DIAGNOSIS — H353221 Exudative age-related macular degeneration, left eye, with active choroidal neovascularization: Secondary | ICD-10-CM | POA: Diagnosis not present

## 2016-11-30 DIAGNOSIS — K5909 Other constipation: Secondary | ICD-10-CM | POA: Diagnosis not present

## 2016-11-30 DIAGNOSIS — K6389 Other specified diseases of intestine: Secondary | ICD-10-CM | POA: Diagnosis not present

## 2016-12-14 DIAGNOSIS — L570 Actinic keratosis: Secondary | ICD-10-CM | POA: Diagnosis not present

## 2016-12-14 DIAGNOSIS — D485 Neoplasm of uncertain behavior of skin: Secondary | ICD-10-CM | POA: Diagnosis not present

## 2016-12-14 DIAGNOSIS — C44119 Basal cell carcinoma of skin of left eyelid, including canthus: Secondary | ICD-10-CM | POA: Diagnosis not present

## 2016-12-14 DIAGNOSIS — D0462 Carcinoma in situ of skin of left upper limb, including shoulder: Secondary | ICD-10-CM | POA: Diagnosis not present

## 2016-12-14 DIAGNOSIS — Z85828 Personal history of other malignant neoplasm of skin: Secondary | ICD-10-CM | POA: Diagnosis not present

## 2016-12-14 DIAGNOSIS — L820 Inflamed seborrheic keratosis: Secondary | ICD-10-CM | POA: Diagnosis not present

## 2016-12-14 DIAGNOSIS — X32XXXA Exposure to sunlight, initial encounter: Secondary | ICD-10-CM | POA: Diagnosis not present

## 2016-12-15 DIAGNOSIS — M810 Age-related osteoporosis without current pathological fracture: Secondary | ICD-10-CM | POA: Diagnosis not present

## 2016-12-15 DIAGNOSIS — I10 Essential (primary) hypertension: Secondary | ICD-10-CM | POA: Diagnosis not present

## 2016-12-15 DIAGNOSIS — F325 Major depressive disorder, single episode, in full remission: Secondary | ICD-10-CM | POA: Diagnosis not present

## 2016-12-15 DIAGNOSIS — E7800 Pure hypercholesterolemia, unspecified: Secondary | ICD-10-CM | POA: Diagnosis not present

## 2017-01-09 DIAGNOSIS — H353221 Exudative age-related macular degeneration, left eye, with active choroidal neovascularization: Secondary | ICD-10-CM | POA: Diagnosis not present

## 2017-01-09 DIAGNOSIS — H353132 Nonexudative age-related macular degeneration, bilateral, intermediate dry stage: Secondary | ICD-10-CM | POA: Diagnosis not present

## 2017-01-12 DIAGNOSIS — C44119 Basal cell carcinoma of skin of left eyelid, including canthus: Secondary | ICD-10-CM | POA: Diagnosis not present

## 2017-01-12 DIAGNOSIS — Z85828 Personal history of other malignant neoplasm of skin: Secondary | ICD-10-CM | POA: Diagnosis not present

## 2017-02-13 DIAGNOSIS — H353221 Exudative age-related macular degeneration, left eye, with active choroidal neovascularization: Secondary | ICD-10-CM | POA: Diagnosis not present

## 2017-02-14 ENCOUNTER — Ambulatory Visit: Payer: PPO | Attending: Student | Admitting: Physical Therapy

## 2017-02-14 DIAGNOSIS — M5441 Lumbago with sciatica, right side: Secondary | ICD-10-CM | POA: Diagnosis not present

## 2017-02-14 DIAGNOSIS — R293 Abnormal posture: Secondary | ICD-10-CM | POA: Diagnosis not present

## 2017-02-14 DIAGNOSIS — G8929 Other chronic pain: Secondary | ICD-10-CM | POA: Insufficient documentation

## 2017-02-14 DIAGNOSIS — M533 Sacrococcygeal disorders, not elsewhere classified: Secondary | ICD-10-CM | POA: Insufficient documentation

## 2017-02-14 DIAGNOSIS — M6281 Muscle weakness (generalized): Secondary | ICD-10-CM | POA: Diagnosis not present

## 2017-02-14 NOTE — Patient Instructions (Signed)
Proper sitting and no more crossing legs      Standing:  10 reps on both sides x 3 x day     3 point tap   Feet are hip width Tap forward, center under hip not feet next to each other  Tap middle\, center  Tap back       _Figure-4 and then toe touch to the ground behind you along a diagonal

## 2017-02-15 DIAGNOSIS — D0462 Carcinoma in situ of skin of left upper limb, including shoulder: Secondary | ICD-10-CM | POA: Diagnosis not present

## 2017-02-15 NOTE — Therapy (Signed)
[place] MAIN [facility] SERVICES [address] [place], [place], [address] Phone: [phone]   Fax:  [phone]  Physical Therapy Evaluation  Patient Details  Name: Jasmine Chung MRN: [id number] Date of Birth: 1931-07-17 Referring Provider: Tammi Klippel   Encounter Date: 02/14/2017      PT End of Session - 02/15/17 1325    Visit Number 1   Number of Visits 12   Date for PT Re-Evaluation 05/09/17   Authorization Type g code    PT Start Time 1400   PT Stop Time 1504   PT Time Calculation (min) 64 min   Activity Tolerance Patient tolerated treatment well;No increased pain   Behavior During Therapy WFL for tasks assessed/performed      Past Medical History:  Diagnosis Date  . Hypercholesteremia   . Hypertension     Past Surgical History:  Procedure Laterality Date  . ABDOMINAL HYSTERECTOMY    . BLADDER REPAIR      There were no vitals filed for this visit.       Subjective Assessment - 02/14/17 1406    Subjective Pt reports she has had constipated which comes and goes, more so in the summer than in the spring and winter. Frequency occurs daily but it is small and long as her ring finger ( Bristol Stool Type 5 and runny) .   "I never feel I am finished. I don't have too much patience with sitting on the toilet. I don't know if I don't sit long enough. Right now I have still need to get rid of some stool but it won't come out. I will not go when I sit on the toilet until I eat a bite. I did have a fallen bladder to start with. " Her MD took out her uterus when she had her bladder tacked and the bladder has fallen back down. The constipation started around this time.  After a test with the camera in teh intestine, she still had some little things in the intestine. Her GI doctor referred her to pelvic PT.    2) LBP with radiating pain down R LE to calf to heel. It bothers her when she sits up straight. "It is annoying".  It does not  hurt her as much as it had before.    3) Urinary leage occurs with dribbling before pt can hold it. Once in a while, pt has leakage before making it to the bathroom.       Pertinent History 3 vaginal delivery without tearing. no fall onto tailbone. Internal hemorrhoids.  Daily water intake: 4 (8 fl oz) of water, camomile tea, coffee 1.5 cup.  "I am afraid I have fallen back on my vegetables and I eat alot sweets. I think ice cream and milk  cause my problem".  She did put effort to eat more leafly greens for several weeks. Pt has had a lot of stress at home and she has not been able to to go to the gym 3x weeks.      Patient Stated Goals I would like to be able to go to the bathroom and feel like I finish             [facility] PT Assessment - 02/15/17 1317      Assessment   Medical Diagnosis Chronic constipation   Referring Provider Tammi Klippel      Precautions   Precautions None     Restrictions   Weight Bearing Restrictions  No     Balance Screen   Has the patient fallen in the past 6 months No     Observation/Other Assessments   Observations slumped sitting      Coordination   Gross Motor Movements are Fluid and Coordinated --  proper diaphragmatic excursion, dyscoordination of pelvic      Palpation   SI assessment  limied R SIJ mobility into hip flexion, R ASIS more anterior , coccyx devaited to R, tenderness and tensions a coccygeus, coccyx slightly deviated to R              Objective measurements completed on examination: See above findings.        Pelvic Floor Special Questions - 02/15/17 1322    Diastasis Recti neg    Pelvic Floor Internal Exam pt consented verbally after being explained details to exam with contraindications   Exam Type Vaginal   Palpation contraction with inhalation  tightness R puborectalis anterior/ posterior> L.     Strength good squeeze, good lift, able to hold agaisnt strong resistance  lower position bladder within introitus,  circumferential con          [facility] Adult PT Treatment/Exercise - 02/15/17 1324      Therapeutic Activites    Therapeutic Activities --  see pt instructions      Exercises   Exercises --  see pt instructions      Manual Therapy   Manual therapy comments L sidelying, long axis distraction ,inferior/ superior mob of sacrum, MWM of ilium over sacrum with hip abd/ add    Internal Pelvic Floor STM, MWM R puborectalis anterior/ posterior> L.                  PT Education - 02/14/17 1459    Education provided Yes   Education Details POC, HEP, goals, anatomy. physiology ,   Person(s) Educated Patient   Methods Explanation;Demonstration;Tactile cues;Verbal cues   Comprehension Verbalized understanding;Returned demonstration             PT Long Term Goals - 02/14/17 1414      PT LONG TERM GOAL #1   Title Pt will decrease her COREFO score from 24 % to < 19% in order to    Time 12   Period Weeks   Status New   Target Date 05/09/17     PT LONG TERM GOAL #2   Title Pt will report improved Stool Type 5 to 3-4 in order to improve GI   Time 8   Period Weeks   Status New   Target Date --     PT LONG TERM GOAL #3   Title Pt will demo proper toileting mechanics with breathing technique in order to eliminate completely.    Time 12   Period Weeks   Status New   Target Date 05/09/17     PT LONG TERM GOAL #4   Title Pt will demo no pelvic obliquties on R and no coccyx deviation to the R across two visits in order to minimzie radiating pain    Time 4   Period Weeks   Status New                Plan - 02/15/17 1326    Clinical Impression Statement Pt is an 83 yo female who reports constipation, difficulty with complete emptying, urinary leakage, and CLBP with radiating pain. These deficits impact her QOL and ADLs. Pt 's clinical presentations include sacroiliac dysfunctions, pelvic obliquities, limited  R SIJ mobility with itenderness, coccyx deviation,  overactive pelvic floor mm, poor posture, weak and dycoordination of deep core mm, Following Tx today, pt demo'd a more symmetrically aligned pelvic girdle, increased SIJ mobility, less pelvic floor tensions, and report of resolved radiating pain.  Pt demo'd proper coordination with breathing and less straining during simulated bowel movements following training.  Plan to continue addressing pt's other deficits at future sessions and observe pt's exercises she performs at teh gym to minimize strain on the pelvic floor.     Clinical Presentation Evolving   Clinical Decision Making Moderate   Rehab Potential Good   PT Frequency 1x / week   PT Duration 12 weeks   PT Treatment/Interventions Therapeutic activities;Therapeutic exercise;Moist Heat;Balance training;Neuromuscular re-education;Gait training;Patient/family education;Manual techniques;Scar mobilization;Energy conservation;Splinting;Functional mobility training;Stair training   Consulted and Agree with Plan of Care Patient      Patient will benefit from skilled therapeutic intervention in order to improve the following deficits and impairments:  Abnormal gait, Decreased coordination, Decreased range of motion, Decreased endurance, Decreased safety awareness, Increased muscle spasms, Decreased activity tolerance, Decreased balance, Decreased mobility, Impaired sensation, Decreased scar mobility, Decreased strength, Hypermobility, Hypomobility, Improper body mechanics, Pain, Difficulty walking  Visit Diagnosis: Muscle weakness (generalized)  Abnormal posture  Sacrococcygeal disorders, not elsewhere classified  Chronic right-sided low back pain with right-sided sciatica      G-Codes - March 08, 2017 1331    Functional Assessment Tool Used (Outpatient Only) COREFO and clinical judgement    Functional Limitation Self care;Changing and maintaining body position   Mobility: Walking and Moving Around Current Status [phone]) At least 20 percent but  less than 40 percent impaired, limited or restricted   Mobility: Walking and Moving Around Goal Status [phone]) At least 1 percent but less than 20 percent impaired, limited or restricted   Self Care Current Status (Y5035) At least 20 percent but less than 40 percent impaired, limited or restricted   Self Care Goal Status (W6568) At least 1 percent but less than 20 percent impaired, limited or restricted       Problem List There are no active problems to display for this patient.   Jerl Mina  ,PT, DPT, E-RYT  02/15/2017, 1:32 PM  [place] MAIN [facility] SERVICES [address] [place], [place], [address] Phone: [phone]   Fax:  [phone]  Name: Aniya Jolicoeur MRN: [id number] Date of Birth: 09-14-1931

## 2017-02-20 DIAGNOSIS — C441192 Basal cell carcinoma of skin of left lower eyelid, including canthus: Secondary | ICD-10-CM | POA: Diagnosis not present

## 2017-02-20 DIAGNOSIS — Z85828 Personal history of other malignant neoplasm of skin: Secondary | ICD-10-CM | POA: Diagnosis not present

## 2017-02-23 ENCOUNTER — Ambulatory Visit: Payer: PPO | Admitting: Physical Therapy

## 2017-02-27 ENCOUNTER — Ambulatory Visit: Payer: PPO | Admitting: Physical Therapy

## 2017-03-09 ENCOUNTER — Ambulatory Visit: Payer: PPO | Admitting: Physical Therapy

## 2017-03-09 DIAGNOSIS — M6281 Muscle weakness (generalized): Secondary | ICD-10-CM

## 2017-03-09 DIAGNOSIS — R293 Abnormal posture: Secondary | ICD-10-CM

## 2017-03-09 DIAGNOSIS — M5441 Lumbago with sciatica, right side: Secondary | ICD-10-CM

## 2017-03-09 DIAGNOSIS — G8929 Other chronic pain: Secondary | ICD-10-CM

## 2017-03-09 DIAGNOSIS — M533 Sacrococcygeal disorders, not elsewhere classified: Secondary | ICD-10-CM

## 2017-03-09 NOTE — Therapy (Signed)
[place] MAIN [facility] SERVICES [address] [place], [place], [address] Phone: [phone]   Fax:  [phone]  Physical Therapy Treatment  Patient Details  Name: Jasmine Chung MRN: [id number] Date of Birth: 1931-09-16 Referring Provider: Tammi Klippel   Encounter Date: 03/09/2017      PT End of Session - 03/09/17 1454    Visit Number 2   Number of Visits 12   Date for PT Re-Evaluation 05/09/17   Authorization Type g code    PT Start Time 1404   PT Stop Time 9528   PT Time Calculation (min) 50 min   Activity Tolerance Patient tolerated treatment well;No increased pain   Behavior During Therapy WFL for tasks assessed/performed      Past Medical History:  Diagnosis Date  . Hypercholesteremia   . Hypertension     Past Surgical History:  Procedure Laterality Date  . ABDOMINAL HYSTERECTOMY    . BLADDER REPAIR      There were no vitals filed for this visit.      Subjective Assessment - 03/09/17 1408    Subjective Pt noticed when she propped up her feet, her bowel movements are better. When she has stress, it messes everything up. Pt is dealing with family stressors when a family member lose patience with her and her short term memory.   Pt started to drink more water to 20 fl oz.  Pt thinks if she can go back to the [facility], she thinks will help her because it has helped her tremendously.  Pt also finds stress relief by being with her friend.                       Pelvic Floor Special Questions - 03/09/17 1448    Diastasis Recti neg    Pelvic Floor Internal Exam pt consented verbally after being explained details to exam with contraindications   Exam Type Vaginal   Palpation cued once to correct dyscoordination . maintained proper coordination for 10 breaths   no tightness R puborectalis anterior/ posterior> L.     Strength good squeeze, good lift, able to hold agaisnt strong resistance  cranial position of  bladder             [facility] Adult PT Treatment/Exercise - 03/09/17 1453      Neuro Re-ed    Neuro Re-ed Details  see pt instructions :  knee behind  toes      Exercises   Exercises --  see pt instructions      Manual Therapy   Internal Pelvic Floor reassessed pelvic floor coordination and no tensions noted                 PT Education - 03/09/17 1507    Education provided Yes   Education Details HEP   Person(s) Educated Patient   Methods Explanation;Demonstration;Tactile cues;Verbal cues;Handout   Comprehension Returned demonstration;Verbalized understanding             PT Long Term Goals - 02/14/17 1414      PT LONG TERM GOAL #1   Title Pt will decrease her COREFO score from 24 % to < 19% in order to    Time 12   Period Weeks   Status New   Target Date 05/09/17     PT LONG TERM GOAL #2   Title Pt will report improved Stool Type 5 to 3-4 in order to improve GI   Time  8   Period Weeks   Status New   Target Date --     PT LONG TERM GOAL #3   Title Pt will demo proper toileting mechanics with breathing technique in order to eliminate completely.    Time 12   Period Weeks   Status New   Target Date 05/09/17     PT LONG TERM GOAL #4   Title Pt will demo no pelvic obliquties on R and no coccyx deviation to the R across two visits in order to minimzie radiating pain    Time 4   Period Weeks   Status New               Plan - 03/09/17 1454    Clinical Impression Statement Pt showed no coccyx deviation nor pelvic floor mm tensions comapred to her last visit. Pt showed improved pelvic floor coordination. Educated pt on increasing water intake from 20 to 28 fl oz this week. Used motivational interviewing to understanding the stressors that impact her constipation and what stress release methods worked best for her. Pt would like to return to going to her exercise classes for stress relief. Educated pt that future sessions will be focused on helping  pt to gradually return to her classes to minimize strain on pelvic floor .  Pt agreed. Initated  heel raises, standing marches, and mini squats for overall strengthening , circulation , and improving motility and recommended pt perform this routine between the TV shows she watches regularly. Pt demo'd correctly. Pt continues to benefit from skilled PT    Rehab Potential Good   PT Frequency 1x / week   PT Duration 12 weeks   PT Treatment/Interventions Therapeutic activities;Therapeutic exercise;Moist Heat;Balance training;Neuromuscular re-education;Gait training;Patient/family education;Manual techniques;Scar mobilization;Energy conservation;Splinting;Functional mobility training;Stair training   Consulted and Agree with Plan of Care Patient      Patient will benefit from skilled therapeutic intervention in order to improve the following deficits and impairments:  Abnormal gait, Decreased coordination, Decreased range of motion, Decreased endurance, Decreased safety awareness, Increased muscle spasms, Decreased activity tolerance, Decreased balance, Decreased mobility, Impaired sensation, Decreased scar mobility, Decreased strength, Hypermobility, Hypomobility, Improper body mechanics, Pain, Difficulty walking  Visit Diagnosis: Muscle weakness (generalized)  Abnormal posture  Sacrococcygeal disorders, not elsewhere classified  Chronic right-sided low back pain with right-sided sciatica     Problem List There are no active problems to display for this patient.   Jerl Mina ,PT, DPT, E-RYT  03/09/2017, 3:11 PM  [place] MAIN [facility] SERVICES [address] [place], [place], [address] Phone: [phone]   Fax:  [phone]  Name: Jasmine Chung MRN: [id number] Date of Birth: April 15, 1932

## 2017-03-09 NOTE — Patient Instructions (Addendum)
Between Netflix shows to increase circulation and motility  Minisquat: Scoot buttocks back slight, hinge like you are looking at your reflection on a pond  Knees behind toes,  Inhale to "smell flowers"  Exhale on the rise "like rocket"  Do not lock knees, have more weight across ballmounds of feet, toes relaxed   10 reps     2 minutes , One hand on chair for stability and less risk for falls  Marching      Heel raises   10 reps  One hand on chair for stability and less risk for falls      _________  Go to the chair exercise class 1 time this week for stress relief and exercise   Increase water intake from 20 fl oz to 28 f l oz.

## 2017-03-23 ENCOUNTER — Ambulatory Visit: Payer: PPO | Attending: Student | Admitting: Physical Therapy

## 2017-03-23 DIAGNOSIS — M5441 Lumbago with sciatica, right side: Secondary | ICD-10-CM | POA: Insufficient documentation

## 2017-03-23 DIAGNOSIS — M6281 Muscle weakness (generalized): Secondary | ICD-10-CM | POA: Insufficient documentation

## 2017-03-23 DIAGNOSIS — R293 Abnormal posture: Secondary | ICD-10-CM | POA: Insufficient documentation

## 2017-03-23 DIAGNOSIS — G8929 Other chronic pain: Secondary | ICD-10-CM | POA: Diagnosis not present

## 2017-03-23 DIAGNOSIS — M533 Sacrococcygeal disorders, not elsewhere classified: Secondary | ICD-10-CM | POA: Insufficient documentation

## 2017-03-23 NOTE — Patient Instructions (Signed)
Opposite reaching with leg back  30 reps total each side       _________   Multifidis twist  Band is on doorknob: stand further away from door (facing perpendicular)   Twisting trunk without moving the hips and knees Hold band at between the ear and shoulder height   Exhale twist,.10-15 deg away from door without moving your hips/ knees. Continue to maintain equal weight through legs. Keep knee unlocked.  10 x 2       __________  Hip extension With bands at chair legs  Hands on chair   10 x 2 reps each

## 2017-03-23 NOTE — Therapy (Signed)
[place] MAIN [facility] SERVICES [address] [place], [place], [address] Phone: [phone]   Fax:  [phone]  Physical Therapy Treatment  Patient Details  Name: Jasmine Chung MRN: [id number] Date of Birth: [birth year] Referring Provider: Tammi Klippel    Encounter Date: 03/23/2017  PT End of Session - 03/23/17 1545    Visit Number  3    Number of Visits  12    Date for PT Re-Evaluation  05/09/17    Authorization Type  g code     PT Start Time  4580    PT Stop Time  1505    PT Time Calculation (min)  60 min    Activity Tolerance  Patient tolerated treatment well;No increased pain    Behavior During Therapy  WFL for tasks assessed/performed       Past Medical History:  Diagnosis Date  . Hypercholesteremia   . Hypertension     Past Surgical History:  Procedure Laterality Date  . ABDOMINAL HYSTERECTOMY    . BLADDER REPAIR      There were no vitals filed for this visit.  Subjective Assessment - 03/23/17 1404    Subjective  Pt reported feeling great after leaving last session. Pt feels she picked up the mop bucket properly and it was heavy . Pt felt radiating pain and numbness at inside her ankle.  No pain with her HEP.  Everything was fine with her bowel movements until today and yesterday when she was not emptying properly and feeling bloated          [facility] PT Assessment - 03/23/17 1407      Sensation   Light Touch  -- decreased sensation at L4 medial ankle R compared to L ( equ    Additional Comments  Deep tendon reflex: 1+ R, 2+ L at knee  ( post Tx: 2+ B)       Strength   Overall Strength  -- hip ext in prone 3/5 R, L 4-/5, hip abd 4/5 B       Palpation   Spinal mobility  Hypomobility at lateral aspects of L2-4 ( post Tx: decreased with no radiating and numbness at medial ankle R)                   [facility] Adult PT Treatment/Exercise - 03/23/17 1417      Neuro Re-ed    Neuro Re-ed Details   see pt  instructions. 10 reps,backward walking/ forward cablecolumn       Manual Therapy   Manual therapy comments  Grade II-III mob 30 reps lateral to medial mobs along lateral aspects of L1-3                  PT Long Term Goals - 03/23/17 1600      PT LONG TERM GOAL #1   Title  Pt will decrease her COREFO score from 24 % to < 19% in order to     Time  12    Period  Weeks    Status  On-going      PT LONG TERM GOAL #2   Title  Pt will report improved Stool Type 5 to 3-4 in order to improve GI    Time  8    Period  Weeks    Status  On-going      PT LONG TERM GOAL #3   Title  Pt will demo proper toileting mechanics with breathing technique in order  to eliminate completely.     Time  12    Period  Weeks    Status  On-going      PT LONG TERM GOAL #4   Title  Pt will demo no pelvic obliquties on R and no coccyx deviation to the R across two visits in order to minimzie radiating pain     Time  4    Period  Weeks    Status  Achieved      PT LONG TERM GOAL #5   Title  Pt will demo increased strength             Plan - 03/23/17 1555    Clinical Impression Statement  Pt is making progress with her bowel function with less difficulty emptying as pt demo'd improved coordination of deep core mm. Pt had a relapse of R sciatic Sx which resolved following Tx today.  Pt 's deep tendon reflex at knee and L4 dermatome sensation at R improved as well. Advanced pt to glut strengthening which was weak. Added eccentric control of glut strengthening with resisted backward walking which pt demo'd difficulty with increased posterior lean of upper body initially. Pt demo'd more postural control after training and tactlie and visual cues.  Pt will liekly be ready for d/c at  her next session. Addressed body mechanics with lifting bucket of watrer which was the cause for pt's relapse of Sx. Educated pt to decrease water amount and make two trips to dump out a light bucket of water. Pt also demo'd  proper lifting mechanics.      Rehab Potential  Good    PT Frequency  1x / week    PT Duration  12 weeks    PT Treatment/Interventions  Therapeutic activities;Therapeutic exercise;Moist Heat;Balance training;Neuromuscular re-education;Gait training;Patient/family education;Manual techniques;Scar mobilization;Energy conservation;Splinting;Functional mobility training;Stair training    Consulted and Agree with Plan of Care  Patient       Patient will benefit from skilled therapeutic intervention in order to improve the following deficits and impairments:  Abnormal gait, Decreased coordination, Decreased range of motion, Decreased endurance, Decreased safety awareness, Increased muscle spasms, Decreased activity tolerance, Decreased balance, Decreased mobility, Impaired sensation, Decreased scar mobility, Decreased strength, Hypermobility, Hypomobility, Improper body mechanics, Pain, Difficulty walking  Visit Diagnosis: Abnormal posture  Muscle weakness (generalized)  Sacrococcygeal disorders, not elsewhere classified  Chronic right-sided low back pain with right-sided sciatica     Problem List There are no active problems to display for this patient.   Jerl Mina ,PT, DPT, E-RYT  03/23/2017, 4:35 PM  [place] MAIN [facility] SERVICES [address] [place], [place], [address] Phone: [phone]   Fax:  [phone]  Name: Jasmine Chung MRN: [id number] Date of Birth: 1932/04/17

## 2017-04-02 IMAGING — CR DG ABDOMEN 1V
1 series · 1 of 1 positions shown · non-contrast
Comparison: None.

CLINICAL DATA: Constipation and bloating

EXAM:
ABDOMEN - 1 VIEW

[abdomen kub]
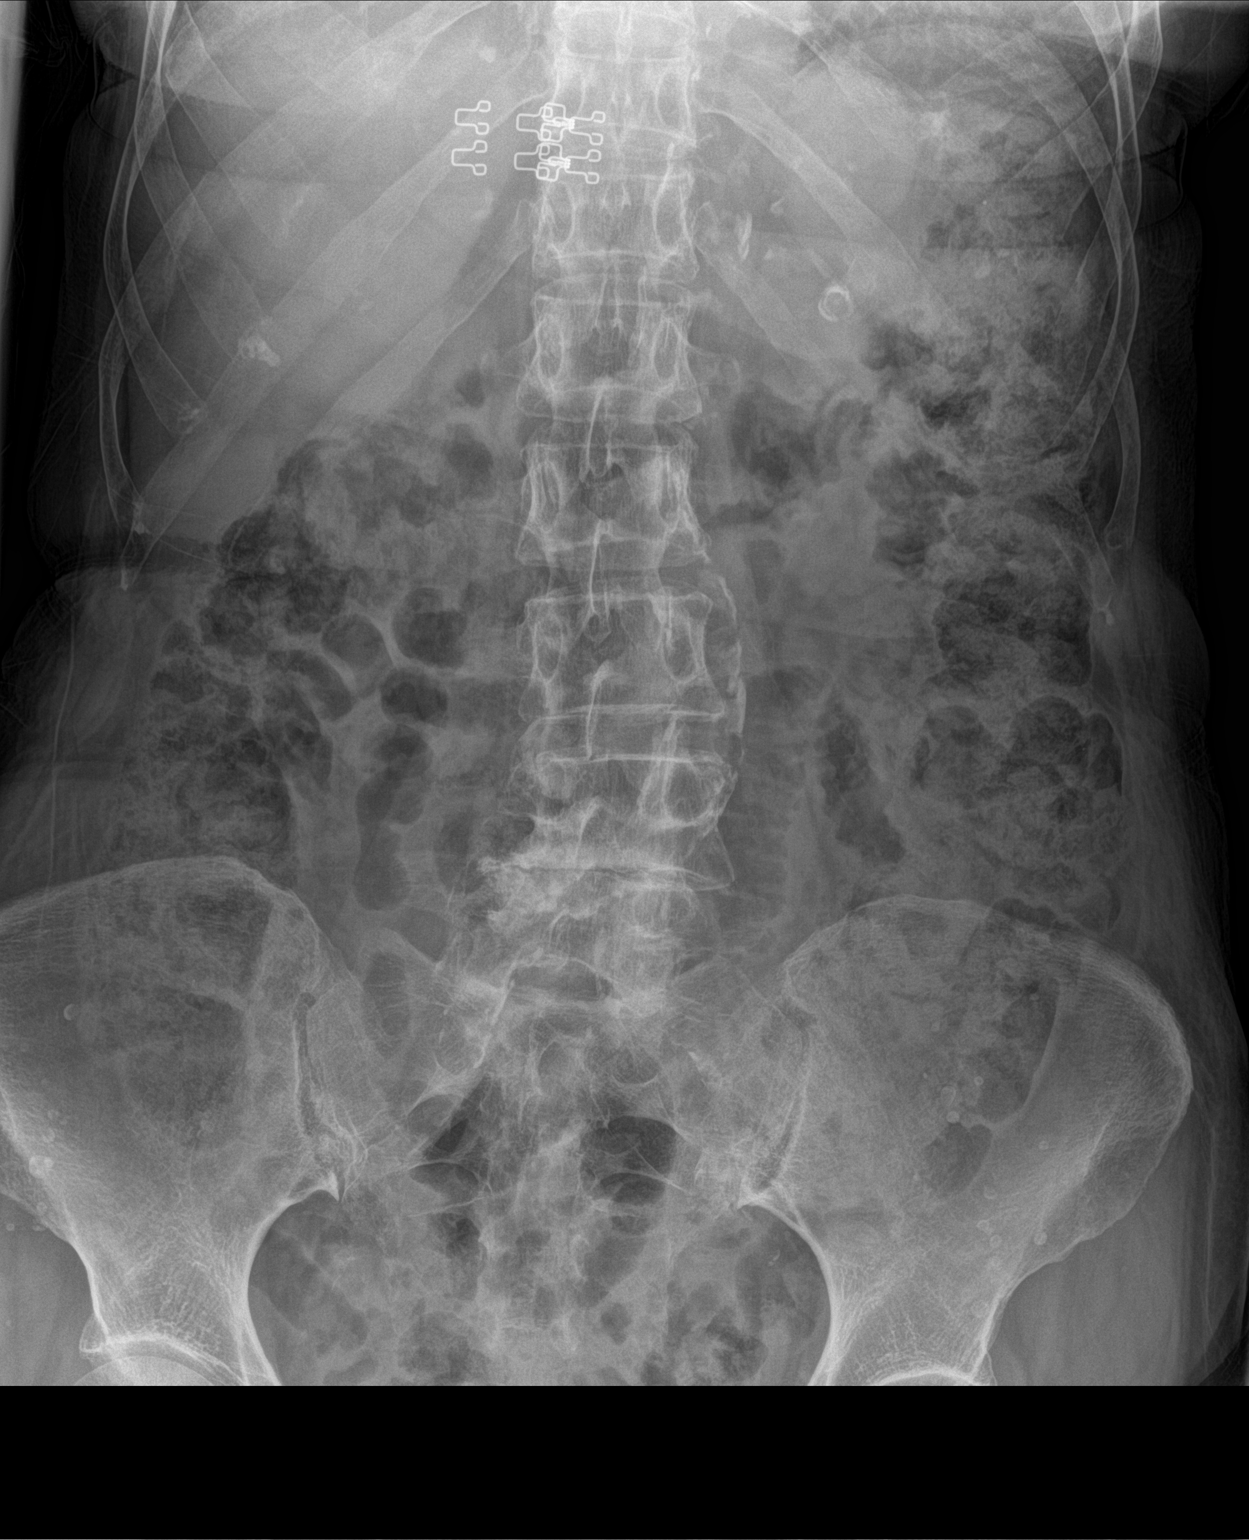

[1 of 1 positions shown; findings below may reference images not displayed]

FINDINGS: No evidence of ileus or obstruction. The patient does have a large
amount of fecal matter throughout the colon. Small bowel gas pattern
is normal. Extensive vascular calcifications/aortic atherosclerosis.
Curvature in chronic degenerative change of the spine.
IMPRESSION: Large amount of fecal matter throughout the colon. Small bowel gas
pattern is normal.

Aortic atherosclerosis.

## 2017-04-04 IMAGING — CR DG ABDOMEN ACUTE W/ 1V CHEST
3 series · 3 of 3 positions shown · non-contrast
Comparison: Chest radiograph October 31, 2006; abdomen radiograph November 19, 2015

CLINICAL DATA: Abdominal fullness.  Recent constipation

EXAM:
DG ABDOMEN ACUTE W/ 1V CHEST

[chest pa]
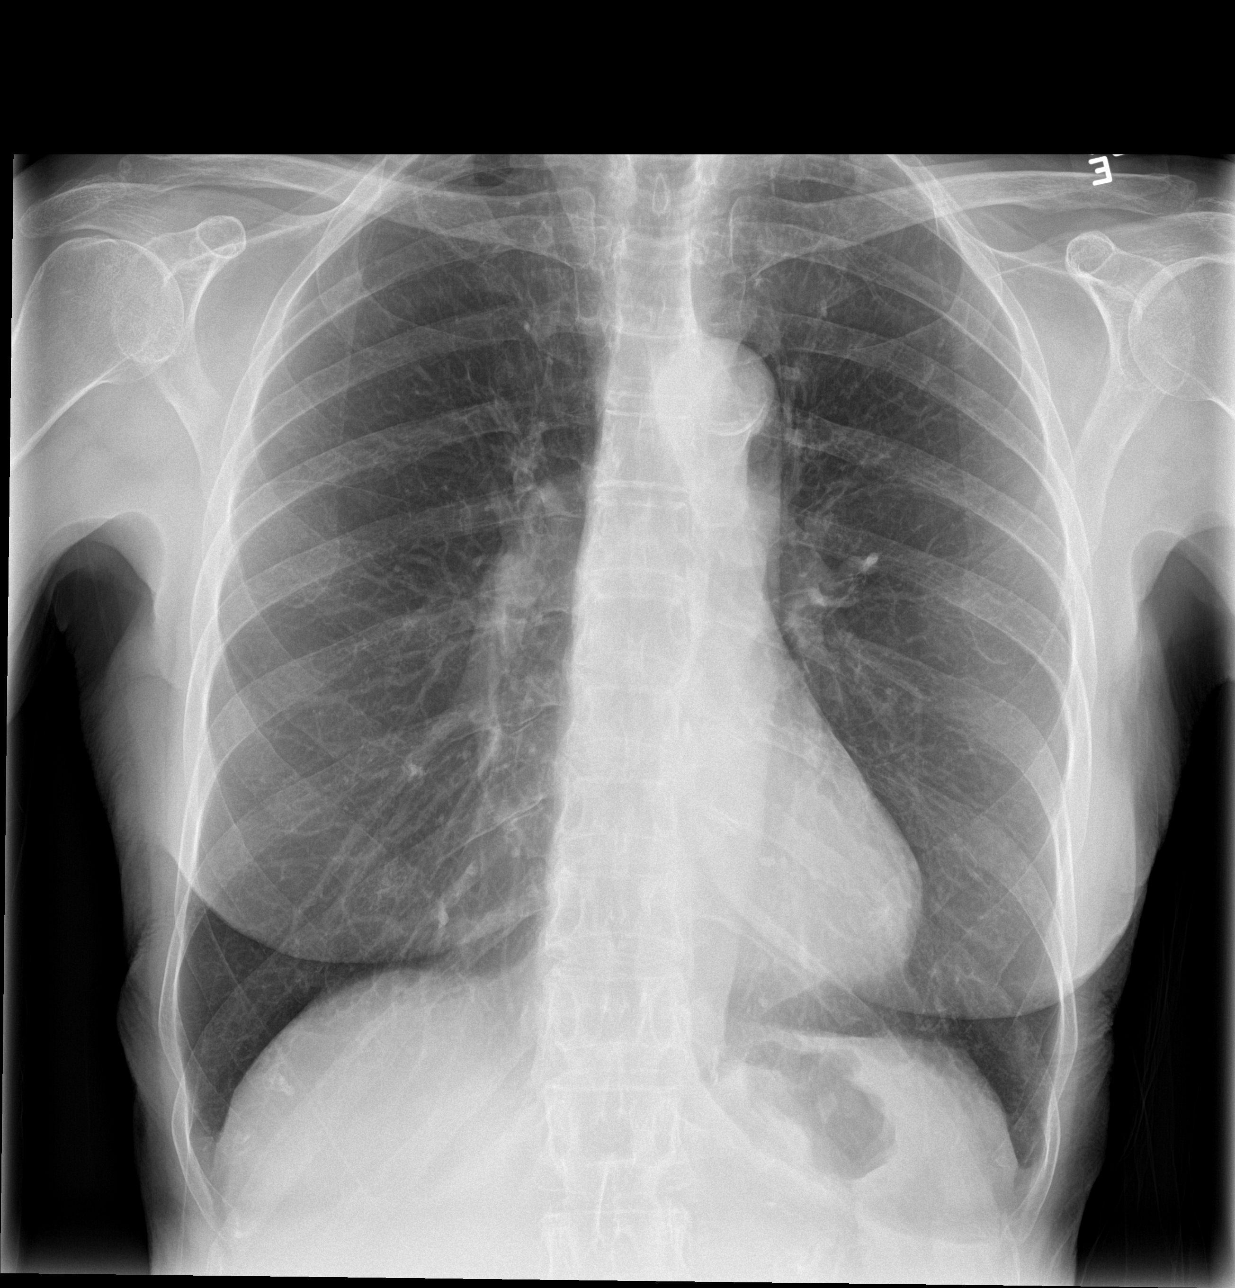

[abdomen erect]
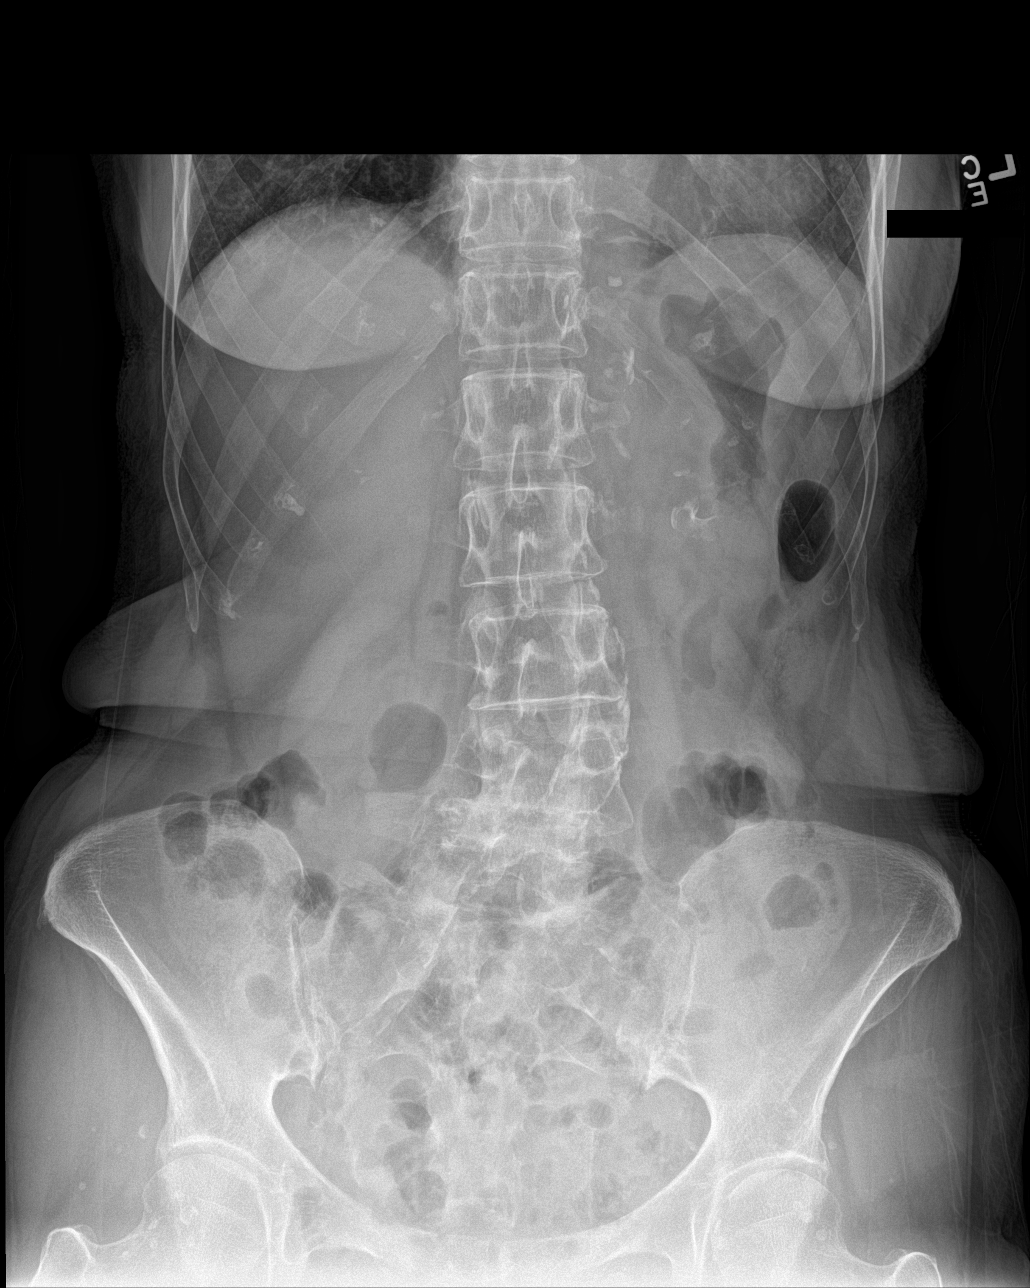

[abdomen supine]
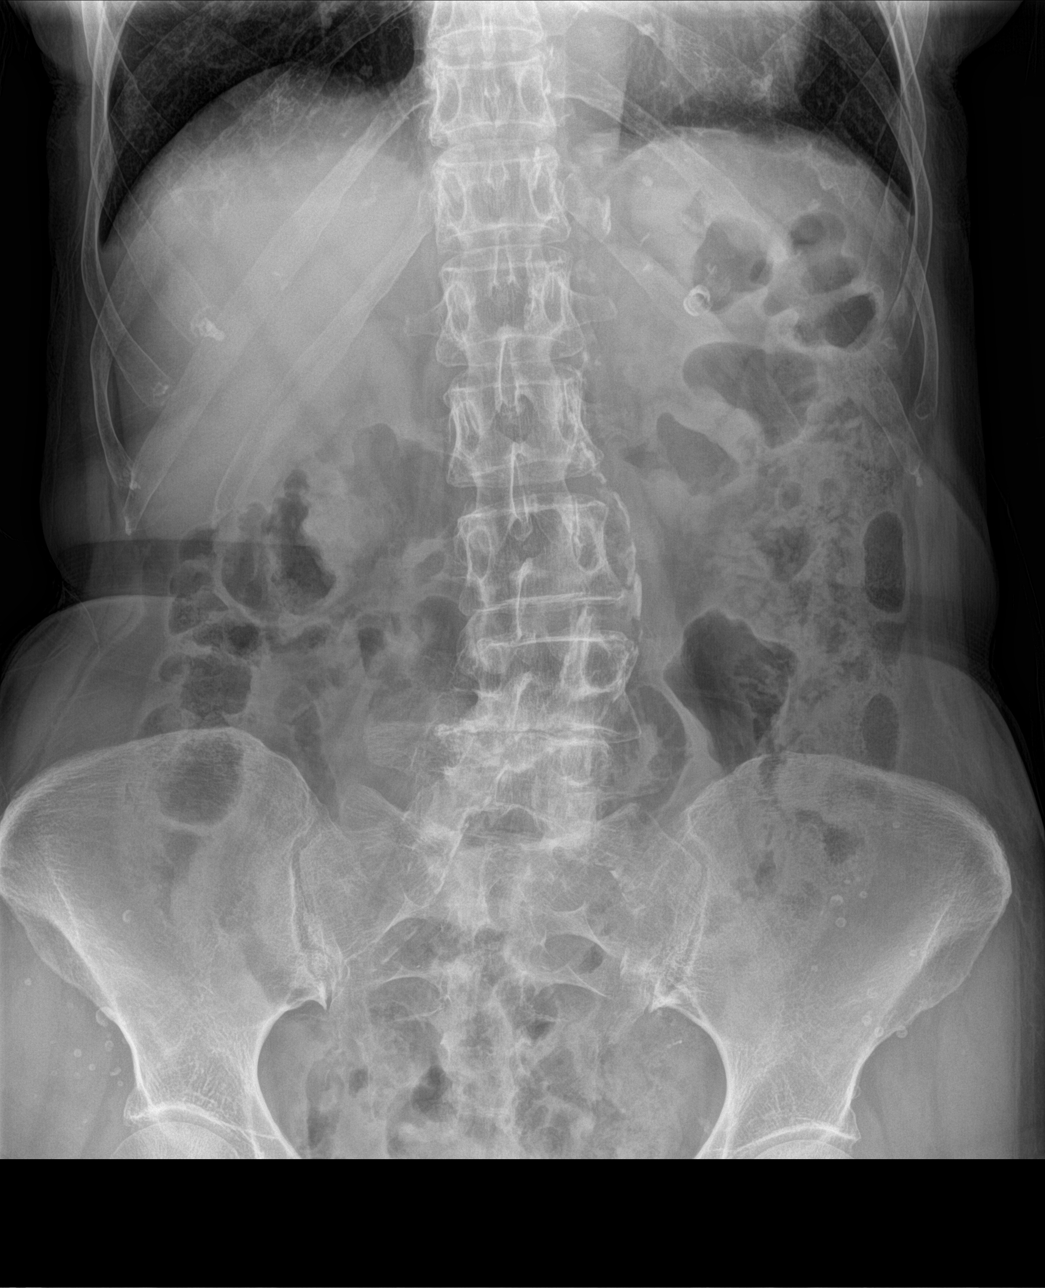

[3 of 3 positions shown; findings below may reference images not displayed]

FINDINGS: PA chest: No edema or consolidation. The heart size and pulmonary
vascularity are normal. There is atherosclerotic calcification in
the aorta. No adenopathy.

Supine and upright abdomen: There is less stool in the colon
compared to 2 days prior. Moderate stool does remain throughout the
colon. There is no bowel dilatation or air-fluid level suggesting
obstruction. No free air. There is atherosclerotic calcification in
the aorta and common iliac arteries.
IMPRESSION: Less stool in colon compared to 2 days prior. Moderate stool remains
throughout colon. No bowel obstruction or free air.

No lung edema or consolidation. There is extensive aortic and iliac
artery atherosclerosis.

## 2017-04-05 ENCOUNTER — Ambulatory Visit: Payer: PPO | Admitting: Physical Therapy

## 2017-04-17 DIAGNOSIS — H353222 Exudative age-related macular degeneration, left eye, with inactive choroidal neovascularization: Secondary | ICD-10-CM | POA: Diagnosis not present

## 2017-04-20 ENCOUNTER — Encounter: Payer: PPO | Admitting: Physical Therapy

## 2017-05-18 DIAGNOSIS — Z85828 Personal history of other malignant neoplasm of skin: Secondary | ICD-10-CM | POA: Diagnosis not present

## 2017-05-18 DIAGNOSIS — X32XXXA Exposure to sunlight, initial encounter: Secondary | ICD-10-CM | POA: Diagnosis not present

## 2017-05-18 DIAGNOSIS — Z08 Encounter for follow-up examination after completed treatment for malignant neoplasm: Secondary | ICD-10-CM | POA: Diagnosis not present

## 2017-05-18 DIAGNOSIS — L570 Actinic keratosis: Secondary | ICD-10-CM | POA: Diagnosis not present

## 2017-05-18 DIAGNOSIS — L821 Other seborrheic keratosis: Secondary | ICD-10-CM | POA: Diagnosis not present

## 2017-05-23 DIAGNOSIS — H353221 Exudative age-related macular degeneration, left eye, with active choroidal neovascularization: Secondary | ICD-10-CM | POA: Diagnosis not present

## 2017-06-13 DIAGNOSIS — E7800 Pure hypercholesterolemia, unspecified: Secondary | ICD-10-CM | POA: Diagnosis not present

## 2017-06-13 DIAGNOSIS — I10 Essential (primary) hypertension: Secondary | ICD-10-CM | POA: Diagnosis not present

## 2017-06-14 DIAGNOSIS — Z85828 Personal history of other malignant neoplasm of skin: Secondary | ICD-10-CM | POA: Diagnosis not present

## 2017-06-14 DIAGNOSIS — L570 Actinic keratosis: Secondary | ICD-10-CM | POA: Diagnosis not present

## 2017-06-14 DIAGNOSIS — L821 Other seborrheic keratosis: Secondary | ICD-10-CM | POA: Diagnosis not present

## 2017-06-19 DIAGNOSIS — R809 Proteinuria, unspecified: Secondary | ICD-10-CM | POA: Diagnosis not present

## 2017-06-19 DIAGNOSIS — Z0000 Encounter for general adult medical examination without abnormal findings: Secondary | ICD-10-CM | POA: Diagnosis not present

## 2017-06-19 DIAGNOSIS — I10 Essential (primary) hypertension: Secondary | ICD-10-CM | POA: Diagnosis not present

## 2017-06-19 DIAGNOSIS — F325 Major depressive disorder, single episode, in full remission: Secondary | ICD-10-CM | POA: Diagnosis not present

## 2017-06-27 DIAGNOSIS — H353221 Exudative age-related macular degeneration, left eye, with active choroidal neovascularization: Secondary | ICD-10-CM | POA: Diagnosis not present

## 2017-08-01 DIAGNOSIS — H353221 Exudative age-related macular degeneration, left eye, with active choroidal neovascularization: Secondary | ICD-10-CM | POA: Diagnosis not present

## 2017-09-11 DIAGNOSIS — H353221 Exudative age-related macular degeneration, left eye, with active choroidal neovascularization: Secondary | ICD-10-CM | POA: Diagnosis not present

## 2017-10-16 DIAGNOSIS — H353132 Nonexudative age-related macular degeneration, bilateral, intermediate dry stage: Secondary | ICD-10-CM | POA: Diagnosis not present

## 2017-10-16 DIAGNOSIS — H353221 Exudative age-related macular degeneration, left eye, with active choroidal neovascularization: Secondary | ICD-10-CM | POA: Diagnosis not present

## 2017-10-24 DIAGNOSIS — H6123 Impacted cerumen, bilateral: Secondary | ICD-10-CM | POA: Diagnosis not present

## 2017-10-24 DIAGNOSIS — H903 Sensorineural hearing loss, bilateral: Secondary | ICD-10-CM | POA: Diagnosis not present

## 2017-11-03 DIAGNOSIS — M810 Age-related osteoporosis without current pathological fracture: Secondary | ICD-10-CM | POA: Diagnosis not present

## 2017-11-27 DIAGNOSIS — H353221 Exudative age-related macular degeneration, left eye, with active choroidal neovascularization: Secondary | ICD-10-CM | POA: Diagnosis not present

## 2017-12-13 DIAGNOSIS — M810 Age-related osteoporosis without current pathological fracture: Secondary | ICD-10-CM | POA: Diagnosis not present

## 2018-01-08 DIAGNOSIS — H353221 Exudative age-related macular degeneration, left eye, with active choroidal neovascularization: Secondary | ICD-10-CM | POA: Diagnosis not present

## 2018-01-09 DIAGNOSIS — S0502XA Injury of conjunctiva and corneal abrasion without foreign body, left eye, initial encounter: Secondary | ICD-10-CM | POA: Diagnosis not present

## 2018-02-20 DIAGNOSIS — H353221 Exudative age-related macular degeneration, left eye, with active choroidal neovascularization: Secondary | ICD-10-CM | POA: Diagnosis not present

## 2018-03-25 IMAGING — CR DG ABDOMEN 1V
1 series · 1 of 1 positions shown · non-contrast
Comparison: 11/28/2015

CLINICAL DATA: Three-week history of constipation.

EXAM:
ABDOMEN - 1 VIEW

[t abdomen supine]
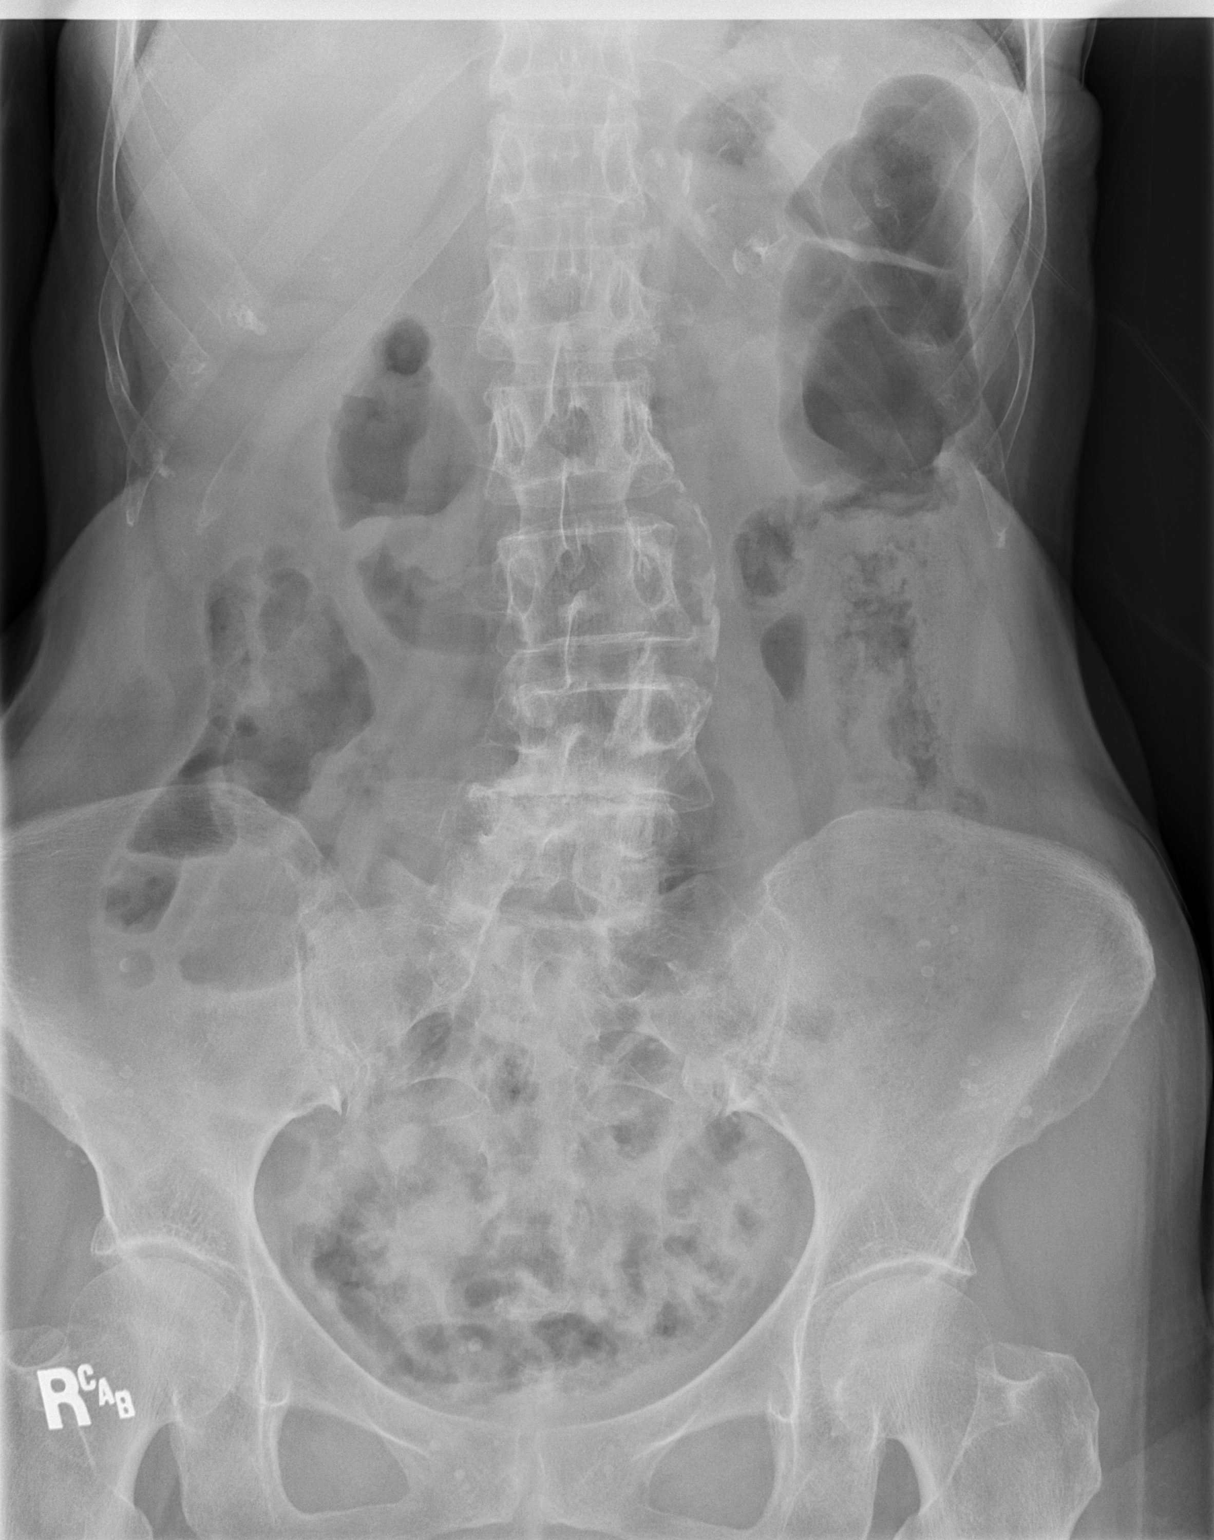

[1 of 1 positions shown; findings below may reference images not displayed]

FINDINGS: The bowel gas pattern is unremarkable. Scattered air in the small
bowel and colon. No significant stool burden. The soft tissue
shadows are maintained. No free air. The bony structures are
unremarkable. Stable extensive vascular calcifications. The bony
structures are intact.
IMPRESSION: No plain film findings for an acute abdominal process. No
significant stool burden.

## 2018-04-03 DIAGNOSIS — H353221 Exudative age-related macular degeneration, left eye, with active choroidal neovascularization: Secondary | ICD-10-CM | POA: Diagnosis not present

## 2018-04-18 DIAGNOSIS — J069 Acute upper respiratory infection, unspecified: Secondary | ICD-10-CM | POA: Diagnosis not present

## 2018-04-18 DIAGNOSIS — I10 Essential (primary) hypertension: Secondary | ICD-10-CM | POA: Diagnosis not present

## 2018-04-18 DIAGNOSIS — R202 Paresthesia of skin: Secondary | ICD-10-CM | POA: Diagnosis not present

## 2018-05-15 DIAGNOSIS — H353221 Exudative age-related macular degeneration, left eye, with active choroidal neovascularization: Secondary | ICD-10-CM | POA: Diagnosis not present

## 2020-12-28 ENCOUNTER — Other Ambulatory Visit: Payer: Self-pay | Admitting: Specialist

## 2020-12-28 DIAGNOSIS — Z1231 Encounter for screening mammogram for malignant neoplasm of breast: Secondary | ICD-10-CM

## 2023-04-10 ENCOUNTER — Emergency Department
Admission: EM | Admit: 2023-04-10 | Discharge: 2023-04-10 | Disposition: A | Discharge status: Home / Self Care | Payer: Medicare HMO | Attending: Emergency Medicine | Admitting: Emergency Medicine

## 2023-04-10 ENCOUNTER — Other Ambulatory Visit: Payer: Self-pay

## 2023-04-10 ENCOUNTER — Emergency Department: Payer: Medicare HMO

## 2023-04-10 ENCOUNTER — Encounter: Payer: Self-pay | Admitting: Emergency Medicine

## 2023-04-10 DIAGNOSIS — F0150 Vascular dementia without behavioral disturbance: Secondary | ICD-10-CM | POA: Insufficient documentation

## 2023-04-10 DIAGNOSIS — X58XXXA Exposure to other specified factors, initial encounter: Secondary | ICD-10-CM | POA: Insufficient documentation

## 2023-04-10 DIAGNOSIS — S0990XA Unspecified injury of head, initial encounter: Secondary | ICD-10-CM | POA: Diagnosis present

## 2023-04-10 DIAGNOSIS — S0003XA Contusion of scalp, initial encounter: Secondary | ICD-10-CM | POA: Insufficient documentation

## 2023-04-10 DIAGNOSIS — I10 Essential (primary) hypertension: Secondary | ICD-10-CM | POA: Insufficient documentation

## 2023-04-10 LAB — URINALYSIS, ROUTINE W REFLEX MICROSCOPIC
Bilirubin Urine: NEGATIVE
Glucose, UA: NEGATIVE mg/dL
Hgb urine dipstick: NEGATIVE
Ketones, ur: 5 mg/dL — AB
Leukocytes,Ua: NEGATIVE
Nitrite: NEGATIVE
Protein, ur: NEGATIVE mg/dL
Specific Gravity, Urine: 1.003 — ABNORMAL LOW (ref 1.005–1.030)
pH: 7 (ref 5.0–8.0)

## 2023-04-10 LAB — BASIC METABOLIC PANEL
Anion gap: 9 (ref 5–15)
BUN: 14 mg/dL (ref 8–23)
CO2: 24 mmol/L (ref 22–32)
Calcium: 9 mg/dL (ref 8.9–10.3)
Chloride: 102 mmol/L (ref 98–111)
Creatinine, Ser: 1.03 mg/dL — ABNORMAL HIGH (ref 0.44–1.00)
GFR, Estimated: 52 mL/min — ABNORMAL LOW (ref >60)
Glucose, Bld: 101 mg/dL — ABNORMAL HIGH (ref 70–99)
Potassium: 4.2 mmol/L (ref 3.5–5.1)
Sodium: 135 mmol/L (ref 135–145)

## 2023-04-10 LAB — CBC
HCT: 37.6 % (ref 36.0–46.0)
Hemoglobin: 11.9 g/dL — ABNORMAL LOW (ref 12.0–15.0)
MCH: 28.7 pg (ref 26.0–34.0)
MCHC: 31.6 g/dL (ref 30.0–36.0)
MCV: 90.6 fL (ref 80.0–100.0)
Platelets: 189 10*3/uL (ref 150–400)
RBC: 4.15 MIL/uL (ref 3.87–5.11)
RDW: 13.1 % (ref 11.5–15.5)
WBC: 7.3 10*3/uL (ref 4.0–10.5)
nRBC: 0 % (ref 0.0–0.2)

## 2023-04-10 MED ORDER — SODIUM CHLORIDE 0.9 % IV BOLUS
500.0000 mL | Freq: Once | INTRAVENOUS | Status: AC
  Administered 2023-04-10: 500 mL via INTRAVENOUS

## 2023-04-10 NOTE — ED Triage Notes (Signed)
Patient to ED via [facility] from home for dizziness. Pt reports taking trazodone yesterday to sleep and woke up feeling dizzy with a knot on her head. Denies any falls or blood thinners.

## 2023-04-10 NOTE — Discharge Instructions (Signed)
Apply an ice pack to the swelling on the head.  Return to the ER for new, worsening, or persistent severe headache, dizziness, recurrent falls, weakness, or any other new or worsening symptoms that concern you.

## 2023-04-10 NOTE — ED Provider Notes (Signed)
[facility] Provider Note    Event Date/Time   First MD Initiated Contact with Patient 04/10/23 1608     (approximate)   History   Dizziness   HPI  Jasmine Chung is a 89 y.o. female with a history of hypertension, hypercholesterolemia, vascular dementia, and DJD who presents with dizziness and a knot on her head.  The patient states that she felt dizzy since she woke up this morning.  She describes it as feeling "woozy" or as if the room is spinning.  She then noted a "knot" on the top of her head which is painful.  She does not remember falling or hitting her head but thinks that she may have fallen out of bed.  She denies any neck or back pain.  She has no other injuries.  She states she is feeling less dizzy now although has not tried to get up since being in the ED.  I reviewed the past medical records.  The patient's most recent outpatient encounter was with family medicine on 11/21.  She reported insomnia at that time and was prescribed trazodone.   Physical Exam   Triage Vital Signs: ED Triage Vitals  Encounter Vitals Group     BP 04/10/23 1349 (!) 127/58     Systolic BP Percentile --      Diastolic BP Percentile --      Pulse Rate 04/10/23 1349 64     Resp 04/10/23 1349 18     Temp 04/10/23 1349 97.7 F (36.5 C)     Temp Source 04/10/23 1349 Oral     SpO2 04/10/23 1349 94 %     Weight 04/10/23 1350 108 lb 0.4 oz (49 kg)     Height 04/10/23 1350 5' (1.524 m)     Head Circumference --      Peak Flow --      Pain Score 04/10/23 1350 0     Pain Loc --      Pain Education --      Exclude from Growth Chart --     Most recent vital signs: Vitals:   04/10/23 1927 04/10/23 1928  BP: 135/70 120/76  Pulse: 84 90  Resp:    Temp:    SpO2: 100% 97%     General: Alert, comfortable appearing, no distress.  CV:  Good peripheral perfusion.  Resp:  Normal effort.  Abd:  No distention.  Other:  EOMI.  PERRLA.  No nystagmus.  No facial droop.   Motor intact in all extremities.  Normal speech.  Approximately 4 cm hematoma to the parietal scalp.  No midline cervical spinal tenderness.  Neck supple, full ROM.   ED Results / Procedures / Treatments   Labs (all labs ordered are listed, but only abnormal results are displayed) Labs Reviewed  BASIC METABOLIC PANEL - Abnormal; Notable for the following components:      Result Value   Glucose, Bld 101 (*)    Creatinine, Ser 1.03 (*)    GFR, Estimated 52 (*)    All other components within normal limits  CBC - Abnormal; Notable for the following components:   Hemoglobin 11.9 (*)    All other components within normal limits  URINALYSIS, ROUTINE W REFLEX MICROSCOPIC - Abnormal; Notable for the following components:   Color, Urine COLORLESS (*)    APPearance CLEAR (*)    Specific Gravity, Urine 1.003 (*)    Ketones, ur 5 (*)    All other components  within normal limits  CBG MONITORING, ED     EKG  ED ECG REPORT I, Dionne Bucy, the attending physician, personally viewed and interpreted this ECG.  Date: 04/10/2023 EKG Time: 1358 Rate: 61 Rhythm: normal sinus rhythm QRS Axis: normal Intervals: normal ST/T Wave abnormalities: normal Narrative Interpretation: no evidence of acute ischemia    RADIOLOGY  CT head: I independently viewed and interpreted the images; there is no ICH or other acute intracranial abnormality.  There is a scalp hematoma.  Radiology report indicates no other acute findings.   PROCEDURES:  Critical Care performed: No  Procedures   MEDICATIONS ORDERED IN ED: Medications  sodium chloride 0.9 % bolus 500 mL (500 mLs Intravenous New Bag/Given 04/10/23 1734)     IMPRESSION / MDM / ASSESSMENT AND PLAN / ED COURSE  I reviewed the triage vital signs and the nursing notes.  89 year old female with PMH as noted above presents with nonspecific dizziness which seems to have a moment of vertigo since she awoke this morning.  She also noted a  hematoma to her scalp but does not remember falling.  On exam the vital signs are normal.  The patient is alert with a nonfocal neurologic exam and does have a palpable hematoma to the scalp.  Differential diagnosis includes, but is not limited to, peripheral vertigo, central vertigo, other acute CVA, intracranial hemorrhage, dehydration, orthostatic hypotension, electrolyte abnormality, other metabolic disturbance, UTI or other infection.  We will obtain CT head, labs, give a fluid bolus, and reassess checking orthostatics.  Patient's presentation is most consistent with acute presentation with potential threat to life or bodily function.  The patient is on the cardiac monitor to evaluate for evidence of arrhythmia and/or significant heart rate changes.   ----------------------------------------- 7:58 PM on 04/10/2023 -----------------------------------------  CT head is negative.  Lab workup is unremarkable with normal electrolytes, no leukocytosis or anemia, and a negative urinalysis.  The patient did not have any orthostatic hypotension and did not get dizzy when standing up.  I suspect that the patient fell out of bed earlier and the patient and her daughter agree that this is most likely.  She may have had a mild concussion causing the dizziness or could have been slightly dehydrated.  However, based on the negative workup and resolved symptoms at this time she is stable for discharge home.  I counseled her and the daughter on the results of the workup and on return precautions; they expressed understanding.  FINAL CLINICAL IMPRESSION(S) / ED DIAGNOSES   Final diagnoses:  Minor head injury, initial encounter  Hematoma of scalp, initial encounter     Rx / DC Orders   ED Discharge Orders     None        Note:  This document was prepared using Dragon voice recognition software and may include unintentional dictation errors.   Dionne Bucy, MD 04/10/23 2000

## 2023-06-01 ENCOUNTER — Emergency Department
Admission: EM | Admit: 2023-06-01 | Discharge: 2023-06-01 | Disposition: A | Discharge status: Home / Self Care | Payer: Medicare HMO

## 2023-06-01 NOTE — ED Triage Notes (Signed)
First Nurse Note:  Pt via [facility] from home. Pt c/o weakness and dizziness. EMS report orthostatic changes +. Pt is A&OX4 and NAD  80/40 BP initially, last BP 130/80 after bolus  Reports pinpoint  20 G L forearm  75 CBG

## 2023-06-01 NOTE — ED Notes (Signed)
Patients daughter reports when brought to triage room she is going to take her mom to [place]

## 2023-06-01 NOTE — ED Notes (Signed)
IV that was placed by EMS was removed.
